# Patient Record
Sex: Male | Born: 1980 | Race: Black or African American | Hispanic: No | Smoking: Never smoker
Health system: Southern US, Community
[De-identification: ages and names within clinical notes are randomized; demographics above are authoritative.]

## PROBLEM LIST (undated history)

## (undated) DIAGNOSIS — I10 Essential (primary) hypertension: Secondary | ICD-10-CM

---

## 2019-01-23 ENCOUNTER — Encounter (HOSPITAL_COMMUNITY): Payer: Self-pay

## 2019-01-23 ENCOUNTER — Other Ambulatory Visit: Payer: Self-pay

## 2019-01-23 DIAGNOSIS — M5441 Lumbago with sciatica, right side: Secondary | ICD-10-CM | POA: Insufficient documentation

## 2019-01-23 DIAGNOSIS — R030 Elevated blood-pressure reading, without diagnosis of hypertension: Secondary | ICD-10-CM | POA: Diagnosis not present

## 2019-01-23 DIAGNOSIS — M545 Low back pain: Secondary | ICD-10-CM | POA: Diagnosis present

## 2019-01-23 NOTE — ED Triage Notes (Signed)
Pt reports lower back pain that radiates to left leg that started today. Pt denies injury to back.

## 2019-01-24 ENCOUNTER — Emergency Department (HOSPITAL_COMMUNITY)
Admission: EM | Admit: 2019-01-24 | Discharge: 2019-01-24 | Disposition: A | Discharge status: Home / Self Care | Payer: BC Managed Care – PPO | Attending: Emergency Medicine | Admitting: Emergency Medicine

## 2019-01-24 DIAGNOSIS — M5442 Lumbago with sciatica, left side: Secondary | ICD-10-CM

## 2019-01-24 DIAGNOSIS — R030 Elevated blood-pressure reading, without diagnosis of hypertension: Secondary | ICD-10-CM

## 2019-01-24 HISTORY — DX: Essential (primary) hypertension: I10

## 2019-01-24 MED ORDER — NAPROXEN 500 MG PO TABS: 500.0000 mg | ORAL_TABLET | Freq: Two times a day (BID) | ORAL | 0 refills | Status: DC

## 2019-01-24 MED ORDER — IBUPROFEN 800 MG PO TABS
800.0000 mg | ORAL_TABLET | Freq: Once | ORAL | Status: AC
  Administered 2019-01-24: 800 mg via ORAL
  Filled 2019-01-24: qty 1

## 2019-01-24 MED ORDER — ORPHENADRINE CITRATE ER 100 MG PO TB12: 100.0000 mg | ORAL_TABLET | Freq: Two times a day (BID) | ORAL | 0 refills | Status: DC

## 2019-01-24 MED ORDER — CYCLOBENZAPRINE HCL 10 MG PO TABS
10.0000 mg | ORAL_TABLET | Freq: Once | ORAL | Status: AC
  Administered 2019-01-24: 10 mg via ORAL
  Filled 2019-01-24: qty 1

## 2019-01-24 NOTE — ED Provider Notes (Signed)
[place] [facility] DEPT Provider Note   CSN: [id number] Arrival date & time: 01/23/19  2234    History   Chief Complaint Low back pain  HPI Mike Richardson is a 38 y.o. male.   The history is provided by the patient.  He had onset this morning pain in the left lower back which has gotten gradually worse through the day.  It is radiating down to the left gluteal area and proximal left posterior thigh.  Pain is dull and he rates it at 9/10.  It is better if he lays flat, worse if he sits up.  There is no associated weakness, numbness, tingling.  He denies any bowel or bladder dysfunction.  His job does involve a lot of bending and lifting, but he denies any unusual activity recently denies any trauma.  He has had low back pain in the past.  He took a dose of BC powder which did give slight, temporary relief.  Past Medical History:  Diagnosis Date  . Hypertension     There are no active problems to display for this patient.   History reviewed. No pertinent surgical history.      Home Medications    Prior to Admission medications   Not on File    Family History History reviewed. No pertinent family history.  Social History Social History   Tobacco Use  . Smoking status: Not on file  Substance Use Topics  . Alcohol use: Not on file  . Drug use: Not on file     Allergies   Patient has no allergy information on record.   Review of Systems Review of Systems  All other systems reviewed and are negative.    Physical Exam Updated Vital Signs BP (!) 157/82 (BP Location: Right Arm)   Pulse 84   Temp 98.2 F (36.8 C)   Resp 16   SpO2 99%   Physical Exam Vitals signs and nursing note reviewed.    38 year old male, resting comfortably and in no acute distress. Vital signs are significant for elevated blood pressure. Oxygen saturation is 99%, which is normal. Head is normocephalic and atraumatic. PERRLA, EOMI. Oropharynx is clear.  Neck is nontender and supple without adenopathy or JVD. Back is nontender in the midline, but there is mild tenderness in the left paralumbar area.  There is mild left paralumbar spasm.  Straight leg raise is negative.  There is no CVA tenderness. Lungs are clear without rales, wheezes, or rhonchi. Chest is nontender. Heart has regular rate and rhythm without murmur. Abdomen is soft, flat, nontender without masses or hepatosplenomegaly and peristalsis is normoactive. Extremities have no cyanosis or edema, full range of motion is present. Skin is warm and dry without rash. Neurologic: Mental status is normal, cranial nerves are intact, there are no motor or sensory deficits.  ED Treatments / Results   Procedures Procedures   Medications Ordered in ED Medications  ibuprofen (ADVIL) tablet 800 mg (has no administration in time range)  cyclobenzaprine (FLEXERIL) tablet 10 mg (has no administration in time range)     Initial Impression / Assessment and Plan / ED Course  I have reviewed the triage vital signs and the nursing notes.  Musculoskeletal low back pain with left-sided sciatica.  No red flags to suggest more serious pathology.  No indication for imaging.  He has no prior records in the [facility] health system.  His discharged with instructions to have blood pressure rechecked as an outpatient, given prescriptions  for naproxen and orphenadrine.  He has recently moved to [facility], he is referred to [facility] to try to establish local PCP.  Final Clinical Impressions(s) / ED Diagnoses   Final diagnoses:  Acute left-sided low back pain with left-sided sciatica  Elevated blood-pressure reading without diagnosis of hypertension    ED Discharge Orders         Ordered    naproxen (NAPROSYN) 500 MG tablet  2 times daily     01/24/19 0135    orphenadrine (NORFLEX) 100 MG tablet  2 times daily     01/24/19 0000000           Delora Fuel, MD [id number] [phone]

## 2019-01-24 NOTE — Discharge Instructions (Addendum)
Apply ice for 20-30 minutes at a time as needed.  You may take acetaminophen as needed for additional pain relief.  Do not take ibuprofen, since it is a similar medication to naproxen.  Your blood pressure was slightly elevated today.  Please have it rechecked several times over the next 1-2 weeks.  If it is staying high, you may need to be on medication to keep it under control.  Uncontrolled hypertension leads to heart attacks, strokes, kidney failure.

## 2019-02-02 ENCOUNTER — Other Ambulatory Visit: Payer: Self-pay

## 2019-02-02 ENCOUNTER — Encounter (HOSPITAL_COMMUNITY): Payer: Self-pay | Admitting: Emergency Medicine

## 2019-02-02 ENCOUNTER — Emergency Department (HOSPITAL_COMMUNITY)
Admission: EM | Admit: 2019-02-02 | Discharge: 2019-02-02 | Disposition: A | Discharge status: Home / Self Care | Payer: BC Managed Care – PPO | Attending: Emergency Medicine | Admitting: Emergency Medicine

## 2019-02-02 ENCOUNTER — Emergency Department (HOSPITAL_COMMUNITY): Payer: BC Managed Care – PPO

## 2019-02-02 DIAGNOSIS — Z5321 Procedure and treatment not carried out due to patient leaving prior to being seen by health care provider: Secondary | ICD-10-CM | POA: Diagnosis not present

## 2019-02-02 DIAGNOSIS — R0789 Other chest pain: Secondary | ICD-10-CM | POA: Diagnosis not present

## 2019-02-02 LAB — BASIC METABOLIC PANEL
Anion gap: 8 (ref 5–15)
BUN: 12 mg/dL (ref 6–20)
CO2: 24 mmol/L (ref 22–32)
Calcium: 9.1 mg/dL (ref 8.9–10.3)
Chloride: 108 mmol/L (ref 98–111)
Creatinine, Ser: 0.87 mg/dL (ref 0.61–1.24)
GFR calc Af Amer: >60 mL/min (ref >60)
GFR calc non Af Amer: >60 mL/min (ref >60)
Glucose, Bld: 104 mg/dL — ABNORMAL HIGH (ref 70–99)
Potassium: 3.8 mmol/L (ref 3.5–5.1)
Sodium: 140 mmol/L (ref 135–145)

## 2019-02-02 LAB — CBC
HCT: 42.9 % (ref 39.0–52.0)
Hemoglobin: 14.3 g/dL (ref 13.0–17.0)
MCH: 33.7 pg (ref 26.0–34.0)
MCHC: 33.3 g/dL (ref 30.0–36.0)
MCV: 101.2 fL — ABNORMAL HIGH (ref 80.0–100.0)
Platelets: 192 10*3/uL (ref 150–400)
RBC: 4.24 MIL/uL (ref 4.22–5.81)
RDW: 11.6 % (ref 11.5–15.5)
WBC: 4.5 10*3/uL (ref 4.0–10.5)
nRBC: 0 % (ref 0.0–0.2)

## 2019-02-02 LAB — TROPONIN I (HIGH SENSITIVITY): Troponin I (High Sensitivity): 6 ng/L (ref <18)

## 2019-02-02 MED ORDER — SODIUM CHLORIDE 0.9% FLUSH: 3.0000 mL | Freq: Once | INTRAVENOUS | Status: DC

## 2019-02-02 NOTE — ED Notes (Signed)
Pt stated feeling better and that he will like to go home

## 2019-02-02 NOTE — ED Triage Notes (Signed)
Pt arrives via [facility] from home for c/o chest pain that radiates into his arm, states pain awoke him from sleep. Pain worse with inhalation and exertion, not reproduceable with palpation, denies n/v/d. Pt given 324mg  asa and 1 sl nitro which decreased pain mildly. vss 111/82, hr 92, 97% on ra, rr 20, 97.9 temp. Pt a/ox4, resp e/u, nad.

## 2019-09-04 ENCOUNTER — Encounter (HOSPITAL_COMMUNITY): Payer: Self-pay

## 2019-09-04 ENCOUNTER — Other Ambulatory Visit: Payer: Self-pay

## 2019-09-04 ENCOUNTER — Emergency Department (HOSPITAL_COMMUNITY)
Admission: EM | Admit: 2019-09-04 | Discharge: 2019-09-05 | Disposition: A | Discharge status: Home / Self Care | Payer: BC Managed Care – PPO | Attending: Emergency Medicine | Admitting: Emergency Medicine

## 2019-09-04 DIAGNOSIS — Z20822 Contact with and (suspected) exposure to covid-19: Secondary | ICD-10-CM | POA: Insufficient documentation

## 2019-09-04 DIAGNOSIS — I10 Essential (primary) hypertension: Secondary | ICD-10-CM | POA: Diagnosis not present

## 2019-09-04 DIAGNOSIS — R45851 Suicidal ideations: Secondary | ICD-10-CM | POA: Diagnosis not present

## 2019-09-04 DIAGNOSIS — F331 Major depressive disorder, recurrent, moderate: Secondary | ICD-10-CM | POA: Diagnosis not present

## 2019-09-04 LAB — RAPID URINE DRUG SCREEN, HOSP PERFORMED
Amphetamines: NOT DETECTED
Barbiturates: NOT DETECTED
Benzodiazepines: NOT DETECTED
Cocaine: NOT DETECTED
Opiates: NOT DETECTED
Tetrahydrocannabinol: NOT DETECTED

## 2019-09-04 LAB — CBC
HCT: 41.2 % (ref 39.0–52.0)
Hemoglobin: 13.8 g/dL (ref 13.0–17.0)
MCH: 33.7 pg (ref 26.0–34.0)
MCHC: 33.5 g/dL (ref 30.0–36.0)
MCV: 100.5 fL — ABNORMAL HIGH (ref 80.0–100.0)
Platelets: 209 10*3/uL (ref 150–400)
RBC: 4.1 MIL/uL — ABNORMAL LOW (ref 4.22–5.81)
RDW: 12 % (ref 11.5–15.5)
WBC: 5.9 10*3/uL (ref 4.0–10.5)
nRBC: 0 % (ref 0.0–0.2)

## 2019-09-04 LAB — SALICYLATE LEVEL: Salicylate Lvl: <7 mg/dL — ABNORMAL LOW (ref 7.0–30.0)

## 2019-09-04 LAB — COMPREHENSIVE METABOLIC PANEL
ALT: 37 U/L (ref 0–44)
AST: 34 U/L (ref 15–41)
Albumin: 4.2 g/dL (ref 3.5–5.0)
Alkaline Phosphatase: 52 U/L (ref 38–126)
Anion gap: 11 (ref 5–15)
BUN: 12 mg/dL (ref 6–20)
CO2: 24 mmol/L (ref 22–32)
Calcium: 9.2 mg/dL (ref 8.9–10.3)
Chloride: 106 mmol/L (ref 98–111)
Creatinine, Ser: 0.76 mg/dL (ref 0.61–1.24)
GFR calc Af Amer: >60 mL/min (ref >60)
GFR calc non Af Amer: >60 mL/min (ref >60)
Glucose, Bld: 87 mg/dL (ref 70–99)
Potassium: 3.7 mmol/L (ref 3.5–5.1)
Sodium: 141 mmol/L (ref 135–145)
Total Bilirubin: 0.7 mg/dL (ref 0.3–1.2)
Total Protein: 7.9 g/dL (ref 6.5–8.1)

## 2019-09-04 LAB — ACETAMINOPHEN LEVEL: Acetaminophen (Tylenol), Serum: <10 ug/mL — ABNORMAL LOW (ref 10–30)

## 2019-09-04 LAB — SARS CORONAVIRUS 2 BY RT PCR (HOSPITAL ORDER, PERFORMED IN ~~LOC~~ HOSPITAL LAB): SARS Coronavirus 2: NEGATIVE

## 2019-09-04 LAB — ETHANOL: Alcohol, Ethyl (B): <10 mg/dL (ref <10)

## 2019-09-04 MED ORDER — ZOLPIDEM TARTRATE 5 MG PO TABS: 5.0000 mg | ORAL_TABLET | Freq: Every evening | ORAL | Status: DC | PRN

## 2019-09-04 MED ORDER — ONDANSETRON HCL 4 MG PO TABS: 4.0000 mg | ORAL_TABLET | Freq: Three times a day (TID) | ORAL | Status: DC | PRN

## 2019-09-04 MED ORDER — ALUM & MAG HYDROXIDE-SIMETH 200-200-20 MG/5ML PO SUSP: 30.0000 mL | Freq: Four times a day (QID) | ORAL | Status: DC | PRN

## 2019-09-04 MED ORDER — ACETAMINOPHEN 325 MG PO TABS: 650.0000 mg | ORAL_TABLET | ORAL | Status: DC | PRN

## 2019-09-04 NOTE — ED Triage Notes (Signed)
Pt reports that he has been feeling suicidal since Tuesday. States that he attempted suicide in 2019. Denies any plan or means to kill himself at this time, but states that his counselor told him to come to the ED if he started experiencing these thoughts. Also reports increased depression.

## 2019-09-04 NOTE — ED Provider Notes (Signed)
[name] DEPT Provider Note   CSN: [id number] Arrival date & time: 09/04/19  1819     History Chief Complaint  Patient presents with  . Suicidal    Mike Richardson is a 39 y.o. male.  HPI 39 year old male presents with suicidal thoughts.  Several years ago he had a suicide attempt.  Over the last several months he has not felt himself but stop short of saying it is actually depression.  This is rapidly worse over the last week and today he has had suicidal thoughts.  No plan.  He is concerned about this and came in for psychiatric evaluation.  He denies feeling ill otherwise including no fever, cough, chest pain, etc. No alcohol or drug abuse.   Past Medical History:  Diagnosis Date  . Hypertension     There are no problems to display for this patient.   History reviewed. No pertinent surgical history.     History reviewed. No pertinent family history.  Social History   Tobacco Use  . Smoking status: Not on file  Substance Use Topics  . Alcohol use: Not on file  . Drug use: Not on file    Home Medications Prior to Admission medications   Medication Sig Start Date End Date Taking? Authorizing Provider  naproxen (NAPROSYN) 500 MG tablet Take 1 tablet (500 mg total) by mouth 2 (two) times daily. Patient not taking: Reported on 7/35/3299 24/2/68   Delora Fuel, MD  orphenadrine (NORFLEX) 100 MG tablet Take 1 tablet (100 mg total) by mouth 2 (two) times daily. Patient not taking: Reported on 3/41/9622 29/7/98   Delora Fuel, MD    Allergies    Patient has no known allergies.  Review of Systems   Review of Systems  Constitutional: Negative for fever.  Respiratory: Negative for cough and shortness of breath.   Cardiovascular: Negative for chest pain.  Gastrointestinal: Negative for vomiting.  Psychiatric/Behavioral: Positive for dysphoric mood and suicidal ideas.  All other systems reviewed and are negative.   Physical  Exam Updated Vital Signs BP (!) 156/96 (BP Location: Left Arm)   Pulse 90   Temp 98.3 F (36.8 C) (Oral)   Resp 16   SpO2 97%   Physical Exam Vitals and nursing note reviewed.  Constitutional:      Appearance: He is well-developed. He is obese.  HENT:     Head: Normocephalic and atraumatic.     Right Ear: External ear normal.     Left Ear: External ear normal.     Nose: Nose normal.  Eyes:     General:        Right eye: No discharge.        Left eye: No discharge.  Cardiovascular:     Rate and Rhythm: Normal rate and regular rhythm.     Heart sounds: Normal heart sounds.  Pulmonary:     Effort: Pulmonary effort is normal.     Breath sounds: Normal breath sounds.  Abdominal:     General: There is no distension.  Musculoskeletal:     Cervical back: Neck supple.  Skin:    General: Skin is warm and dry.  Neurological:     Mental Status: He is alert.  Psychiatric:        Mood and Affect: Mood is depressed. Mood is not anxious.     ED Results / Procedures / Treatments   Labs (all labs ordered are listed, but only abnormal results are displayed) Labs Reviewed  SALICYLATE LEVEL - Abnormal; Notable for the following components:      Result Value   Salicylate Lvl <9.3 (*)    All other components within normal limits  ACETAMINOPHEN LEVEL - Abnormal; Notable for the following components:   Acetaminophen (Tylenol), Serum <10 (*)    All other components within normal limits  CBC - Abnormal; Notable for the following components:   RBC 4.10 (*)    MCV 100.5 (*)    All other components within normal limits  SARS CORONAVIRUS 2 BY RT PCR (HOSPITAL ORDER, [facility])  COMPREHENSIVE METABOLIC PANEL  ETHANOL  RAPID URINE DRUG SCREEN, HOSP PERFORMED    EKG None  Radiology No results found.  Procedures Procedures (including critical care time)  Medications Ordered in ED Medications  acetaminophen (TYLENOL) tablet 650 mg (has no  administration in time range)  zolpidem (AMBIEN) tablet 5 mg (has no administration in time range)  ondansetron (ZOFRAN) tablet 4 mg (has no administration in time range)  alum & mag hydroxide-simeth (MAALOX/MYLANTA) 200-200-20 MG/5ML suspension 30 mL (has no administration in time range)    ED Course  I have reviewed the triage vital signs and the nursing notes.  Pertinent labs & imaging results that were available during my care of the patient were reviewed by me and considered in my medical decision making (see chart for details).    MDM Rules/Calculators/A&P                          Patient presents with suicidality.  He does not have an active plan but certainly is becoming more depressed.  Labs have been reviewed and are benign.  His vitals are benign besides some hypertension.  He appears medically stable for psychiatric disposition.  TTS has been consulted. Final Clinical Impression(s) / ED Diagnoses Final diagnoses:  Suicidal ideation    Rx / DC Orders ED Discharge Orders    None       Sherwood Gambler, MD 09/04/19 2351

## 2019-09-04 NOTE — ED Notes (Signed)
2 Gold Tops collected

## 2019-09-05 DIAGNOSIS — F331 Major depressive disorder, recurrent, moderate: Secondary | ICD-10-CM

## 2019-09-05 MED ORDER — SERTRALINE HCL 25 MG PO TABS: 25.0000 mg | ORAL_TABLET | Freq: Every day | ORAL | 0 refills | Status: DC

## 2019-09-05 MED ORDER — SERTRALINE HCL 50 MG PO TABS
25.0000 mg | ORAL_TABLET | Freq: Every day | ORAL | Status: DC
  Administered 2019-09-05: 25 mg via ORAL
  Filled 2019-09-05: qty 1

## 2019-09-05 NOTE — Consult Note (Signed)
[facility] Psych ED Discharge  09/05/2019 10:17 AM Mike Richardson  MRN:  [id number] Principal Problem: MDD (major depressive disorder), recurrent episode, moderate ([place]) Discharge Diagnoses: Principal Problem:   MDD (major depressive disorder), recurrent episode, moderate ([place])   Subjective: Patient states "I think I need to go to outpatient counseling and therapy." Patient assessed by nurse practitioner.  Patient alert and oriented, answers appropriately. Patient denies suicidal ideations today.  Patient reports suicidal ideations approximately 2 days ago, denies plan or intent.  Patient reports 1 prior suicide attempt, approximately 2 years ago.  Patient denies outpatient psychiatrist.  Patient reports history of seeing outpatient talk therapist but discontinued therapy when "it worked pretty good and I figured I was all right." Patient reports he is interested in antidepressant medication along with therapy.  Discussed antidepressant medications including SSRIs. Patient reports decreased sleep, approximately 4 hours per night.  Patient reports plan to transition to dayshift in approximately 2 weeks.  Patient reports this change to dayshift employment may be a positive one regarding his mood.  Patient reports current stressors include "working night shift and me and my mom do not have the best relationship and that bothers me at times, she never says anything positive, always something negative and she does not encourage me enough as a son." Patient denies homicidal ideations.  Patient denies auditory and visual hallucinations.  Patient denies symptoms of paranoia. Patient reports he resides in [place] with his 3 children ages 43, 31, and 67 years old.  Patient reports he is currently employed, currently works night shift.  Patient denies access to weapons.  Patient denies alcohol and substance use. Patient offered support and encouragement. Patient gives verbal consent to speak with his  girlfriend, Wyvonnia Dusky phone number [phone]. Spoke with patient's girlfriend, Katrina: Patient's girlfriend denies concerns for patient safety.  Patient's girlfriend denies access to weapons.  Patient girlfriend denies any history of self-harm to her knowledge.   Total Time spent with patient: 20 minutes  Past Psychiatric History: Depression  Past Medical History:  Past Medical History:  Diagnosis Date  . Hypertension    History reviewed. No pertinent surgical history. Family History: History reviewed. No pertinent family history. Family Psychiatric  History: None reported Social History:  Social History   Substance and Sexual Activity  Alcohol Use None     Social History   Substance and Sexual Activity  Drug Use Not on file    Social History   Socioeconomic History  . Marital status: Divorced    Spouse name: Not on file  . Number of children: Not on file  . Years of education: Not on file  . Highest education level: Not on file  Occupational History  . Not on file  Tobacco Use  . Smoking status: Not on file  Substance and Sexual Activity  . Alcohol use: Not on file  . Drug use: Not on file  . Sexual activity: Not on file  Other Topics Concern  . Not on file  Social History Narrative  . Not on file   Social Determinants of Health   Financial Resource Strain:   . Difficulty of Paying Living Expenses:   Food Insecurity:   . Worried About Charity fundraiser in the Last Year:   . Arboriculturist in the Last Year:   Transportation Needs:   . Film/video editor (Medical):   Marland Kitchen Lack of Transportation (Non-Medical):   Physical Activity:   . Days of Exercise per Week:   .  Minutes of Exercise per Session:   Stress:   . Feeling of Stress :   Social Connections:   . Frequency of Communication with Friends and Family:   . Frequency of Social Gatherings with Friends and Family:   . Attends Religious Services:   . Active Member of Clubs or  Organizations:   . Attends Archivist Meetings:   Marland Kitchen Marital Status:     Has this patient used any form of tobacco in the last 30 days? (Cigarettes, Smokeless Tobacco, Cigars, and/or Pipes) A prescription for an FDA-approved tobacco cessation medication was offered at discharge and the patient refused  Current Medications: Current Facility-Administered Medications  Medication Dose Route Frequency Provider Last Rate Last Admin  . acetaminophen (TYLENOL) tablet 650 mg  650 mg Oral Q4H PRN Sherwood Gambler, MD      . alum & mag hydroxide-simeth (MAALOX/MYLANTA) 200-200-20 MG/5ML suspension 30 mL  30 mL Oral Q6H PRN Sherwood Gambler, MD      . ondansetron [facility]) tablet 4 mg  4 mg Oral Q8H PRN Sherwood Gambler, MD      . zolpidem (AMBIEN) tablet 5 mg  5 mg Oral QHS PRN Sherwood Gambler, MD       Current Outpatient Medications  Medication Sig Dispense Refill  . naproxen (NAPROSYN) 500 MG tablet Take 1 tablet (500 mg total) by mouth 2 (two) times daily. (Patient not taking: Reported on 09/04/2019) 30 tablet 0  . orphenadrine (NORFLEX) 100 MG tablet Take 1 tablet (100 mg total) by mouth 2 (two) times daily. (Patient not taking: Reported on 09/04/2019) 30 tablet 0   PTA Medications: (Not in a hospital admission)   Musculoskeletal: Strength & Muscle Tone: within normal limits Gait & Station: normal Patient leans: N/A  Psychiatric Specialty Exam: Physical Exam Vitals and nursing note reviewed.  Constitutional:      Appearance: He is well-developed.  HENT:     Head: Normocephalic.  Cardiovascular:     Rate and Rhythm: Normal rate.  Pulmonary:     Effort: Pulmonary effort is normal.  Neurological:     Mental Status: He is alert and oriented to person, place, and time.  Psychiatric:        Attention and Perception: Attention and perception normal.        Mood and Affect: Affect normal. Mood is depressed.        Speech: Speech normal.        Behavior: Behavior normal. Behavior is  cooperative.        Thought Content: Thought content normal.        Cognition and Memory: Cognition and memory normal.        Judgment: Judgment normal.     Review of Systems  Constitutional: Negative.   HENT: Negative.   Eyes: Negative.   Respiratory: Negative.   Cardiovascular: Negative.   Gastrointestinal: Negative.   Genitourinary: Negative.   Musculoskeletal: Negative.   Skin: Negative.   Neurological: Negative.   Hematological: Negative.   Psychiatric/Behavioral: Positive for sleep disturbance.    Blood pressure 125/83, pulse 65, temperature 98.5 F (36.9 C), temperature source Oral, resp. rate 18, SpO2 100 %.There is no height or weight on file to calculate BMI.  General Appearance: Casual and Fairly Groomed  Eye Contact:  Good  Speech:  Clear and Coherent and Normal Rate  Volume:  Normal  Mood:  Depressed  Affect:  Appropriate and Congruent  Thought Process:  Coherent, Goal Directed and Descriptions of Associations: Intact  Orientation:  Full (Time, Place, and Person)  Thought Content:  WDL and Logical  Suicidal Thoughts:  No  Homicidal Thoughts:  No  Memory:  Immediate;   Good Recent;   Good Remote;   Good  Judgement:  Good  Insight:  Good  Psychomotor Activity:  Normal  Concentration:  Concentration: Good and Attention Span: Good  Recall:  Good  Fund of Knowledge:  Good  Language:  Good  Akathisia:  No  Handed:  Right  AIMS (if indicated):     Assets:  Communication Skills Desire for Improvement Financial Resources/Insurance Housing Intimacy Leisure Time Physical Health Resilience Social Support Vocational/Educational  ADL's:  Intact  Cognition:  WNL  Sleep:        Demographic Factors:  Male  Loss Factors: NA  Historical Factors: NA  Risk Reduction Factors:   Responsible for children under 63 years of age, Sense of responsibility to family, Employed, Living with another person, especially a relative, Positive social support, Positive  therapeutic relationship and Positive coping skills or problem solving skills  Continued Clinical Symptoms:  Depression:   Insomnia  Cognitive Features That Contribute To Risk:  None    Suicide Risk:  Minimal: No identifiable suicidal ideation.  Patients presenting with no risk factors but with morbid ruminations; may be classified as minimal risk based on the severity of the depressive symptoms    Plan Of Care/Follow-up recommendations:  Other:  Patient reviewed with Dr. Hampton Abbot.  Recommend Zoloft 25mg  daily.  Follow up with outpatient psychiatry and counseling.   Disposition: Discharge Emmaline Kluver, FNP 09/05/2019, 10:17 AM

## 2019-09-05 NOTE — BH Assessment (Signed)
Comprehensive Clinical Assessment (CCA) Note  09/05/2019 Mike Richardson [id number] -Clinician reviewed note by Dr. Regenia Skeeter.  39 year old male presents with suicidal thoughts.  Several years ago he had a suicide attempt.  Over the last several months he has not felt himself but stop short of saying it is actually depression.  This is rapidly worse over the last week and today he has had suicidal thoughts.  No plan.  He is concerned about this and came in for psychiatric evaluation.  Patient says that he came to [facility] by himself to get help for his suicidal thoughts.  He has no particular plan but he has had a previous attempt in 2019.  At that time he did not seek help.    Patient denies any HI or A/V hallucinations.  He denies use of ETOH or illicit drugs.  Patient says he has been feeling very depressed since around Tuesday (07/13).  He says that he does not see why he should be so depressed because he recently got back together with his girlfriend.  Patient has fair eye contact.  He reports getting poor sleep and having a poor appetite.  Patient is not responding to internal stimuli.  Pt is not engaged in delusional thought process.   Patient has no outpatient services.  No previous inpatient care.  -Clinician discussed patient care with Talbot Grumbling, NP who recommends observation and AM psych evaluation. Clinician informed Dr. Leonides Schanz of the disposition.  Visit Diagnosis:      ICD-10-CM   1. Suicidal ideation  R45.851       CCA Screening, Triage and Referral (STR)  Patient Reported Information How did you hear about [place]? Self  Referral name: No data recorded Referral phone number: No data recorded  Whom do you see for routine medical problems? I don't have a doctor  Practice/Facility Name: No data recorded Practice/Facility Phone Number: No data recorded Name of Contact: No data recorded Contact Number: No data recorded Contact Fax Number: No data recorded Prescriber Name:  No data recorded Prescriber Address (if known): No data recorded  What Is the Reason for Your Visit/Call Today? Not feeling right mentally since 07/13.  How Long Has This Been Causing You Problems? <Week  What Do You Feel Would Help You the Most Today? Assessment Only   Have You Recently Been in Any Inpatient Treatment (Hospital/Detox/Crisis Center/28-Day Program)? No  Name/Location of Program/Hospital:No data recorded How Long Were You There? No data recorded When Were You Discharged? No data recorded  Have You Ever Received Services From [facility] Before? Yes  Who Do You See at [facility]? in the emergency room in December 2020.   Have You Recently Had Any Thoughts About Hurting Yourself? Yes  Are You Planning to Commit Suicide/Harm Yourself At This time? No (Previous attempt in 2019.)   Have you Recently Had Thoughts About [place]? No  Explanation: No data recorded  Have You Used Any Alcohol or Drugs in the Past 24 Hours? No  How Long Ago Did You Use Drugs or Alcohol? No data recorded What Did You Use and How Much? No data recorded  Do You Currently Have a Therapist/Psychiatrist? No  Name of Therapist/Psychiatrist: No data recorded  Have You Been Recently Discharged From Any Office Practice or Programs? No  Explanation of Discharge From Practice/Program: No data recorded    CCA Screening Triage Referral Assessment Type of Contact: Tele-Assessment  Is this Initial or Reassessment? Initial Assessment  Date Telepsych consult ordered in  [facility]:  09/04/19  Time Telepsych consult ordered in [facility]:  1937   Patient Reported Information Reviewed? Yes  Patient Left Without Being Seen? No data recorded Reason for Not Completing Assessment: No data recorded  Collateral Involvement: No data recorded  Does Patient Have a [place]? No data recorded Name and Contact of Legal Guardian: No data recorded If Minor and Not Living with  Parent(s), Who has Custody? No data recorded Is CPS involved or ever been involved? No data recorded Is APS involved or ever been involved? No data recorded  Patient Determined To Be At Risk for Harm To Self or Others Based on Review of Patient Reported Information or Presenting Complaint? Yes, for Self-Harm  Method: No data recorded Availability of Means: No data recorded Intent: No data recorded Notification Required: No data recorded Additional Information for Danger to Others Potential: No data recorded Additional Comments for Danger to Others Potential: No data recorded Are There Guns or Other Weapons in Your Home? No data recorded Types of Guns/Weapons: No data recorded Are These Weapons Safely Secured?                            No data recorded Who Could Verify You Are Able To Have These Secured: No data recorded Do You Have any Outstanding Charges, Pending Court Dates, Parole/Probation? No data recorded Contacted To Inform of Risk of Harm To Self or Others: No data recorded  Location of Assessment: [facility] ED   Does Patient Present under Involuntary Commitment? No  IVC Papers Initial File Date: No data recorded  [place] of Residence: [place]   Patient Currently Receiving the Following Services: Not Receiving Services   Determination of Need: No data recorded  Options For Referral: Therapeutic Triage Services     CCA Biopsychosocial  Intake/Chief Complaint:  CCA Intake With Chief Complaint CCA Part Two Date: 09/05/19 CCA Part Two Time: 0310 Chief Complaint/Presenting Problem: Pt having thoguhts of killing himself Patient's Currently Reported Symptoms/Problems: Depression, SI. Initial Clinical Notes/Concerns: Pt feeling very depressed.  Mental Health Symptoms Depression:  Depression: Change in energy/activity, Hopelessness, Difficulty Concentrating, Increase/decrease in appetite, Weight gain/loss, Worthlessness, Duration of symptoms greater than two weeks  Mania:   Mania: None  Anxiety:   Anxiety: None  Psychosis:  Psychosis: None  Trauma:  Trauma: None  Obsessions:  Obsessions: None  Compulsions:  Compulsions: None  Inattention:  Inattention: None  Hyperactivity/Impulsivity:  Hyperactivity/Impulsivity: N/A  Oppositional/Defiant Behaviors:  Oppositional/Defiant Behaviors: None  Emotional Irregularity:     Other Mood/Personality Symptoms:      Mental Status Exam Appearance and self-care  Stature:  Stature: Tall  Weight:     Clothing:     Grooming:     Cosmetic use:     Posture/gait:     Motor activity:     Sensorium  Attention:     Concentration:     Orientation:  Orientation: X5  Recall/memory:  Recall/Memory: Defective in Recent  Affect and Mood  Affect:  Affect: Blunted, Depressed  Mood:  Mood: Depressed  Relating  Eye contact:  Eye Contact: Normal  Facial expression:  Facial Expression: Depressed  Attitude toward examiner:  Attitude Toward Examiner: Cooperative  Thought and Language  Speech flow: Speech Flow: Clear and Coherent  Thought content:  Thought Content: Appropriate to Mood and Circumstances  Preoccupation:     Hallucinations:  Hallucinations: None  Organization:     [employer] of Knowledge:  Fund of Knowledge: Average  Intelligence:  Intelligence: Average  Abstraction:     Judgement:  Judgement: Fair  Art therapist:  Reality Testing: Realistic  Insight:  Insight: Fair  Decision Making:  Decision Making: Normal  Social Functioning  Social Maturity:     Social Judgement:     Stress  Stressors:  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, Museum/gallery curator, Work  Coping Ability:  Coping Ability: Normal  Skill Deficits:     Supports:  Supports: Friends/Service system     Religion:    Leisure/Recreation:    Exercise/Diet:     CCA Employment/Education  Employment/Work Situation:    Education:     CCA Family/Childhood History  Family and Relationship History:    Childhood History:      Child/Adolescent Assessment:     CCA Substance Use  Alcohol/Drug Use:                           ASAM's:  Six Dimensions of Multidimensional Assessment  Dimension 1:  Acute Intoxication and/or Withdrawal Potential:      Dimension 2:  Biomedical Conditions and Complications:      Dimension 3:  Emotional, Behavioral, or Cognitive Conditions and Complications:     Dimension 4:  Readiness to Change:     Dimension 5:  Relapse, Continued use, or Continued Problem Potential:     Dimension 6:  Recovery/Living Environment:     ASAM Severity Score:    ASAM Recommended Level of Treatment:     Substance use Disorder (SUD)    Recommendations for Services/Supports/Treatments:    DSM5 Diagnoses: There are no problems to display for this patient.   Patient Centered Plan: Patient is on the following Treatment Plan(s):  Depression   Referrals to Alternative Service(s): Referred to Alternative Service(s):   Place:   Date:   Time:    Referred to Alternative Service(s):   Place:   Date:   Time:    Referred to Alternative Service(s):   Place:   Date:   Time:    Referred to Alternative Service(s):   Place:   Date:   Time:     Raymondo Band

## 2019-09-05 NOTE — Discharge Instructions (Signed)
For your behavioral health needs, you are advised to follow up with an outpatient therapist.  You are scheduled for an intake appointment with Shade Flood, LCSW on Monday, September 08, 2019 at 1:00 pm.  Please complete the registration paperwork provided by ED staff prior to the appointment.  If you have any questions, please call the number listed below:       [facility]. [address] [place], [place] [address]      [phone]

## 2019-09-05 NOTE — BH Assessment (Addendum)
[facility] Assessment Progress Note  Per Marvia Pickles, FNP, pt is to be is transferred to the [facility].  Matt agrees to accept pt.  Please call report to [phone].  Pt is to be transported via [facility].  Pt's nurse, Diane, has been notified.   Jalene Mullet, [place] Behavioral Health Coordinator [phone]   Addendum:  Per Letitia Libra, FNP, this voluntary pt does not want to be transferred to [facility] and does not meet criteria for IVC.  Pt would like to have an intake appointment scheduled for outpatient therapy.  At 10:12 I called the [facility] at [place] and spoke to [place].  She has scheduled pt for an appointment with Shade Flood, LCSW on Monday, 09/08/2019 at 13:00.  This has been included in pt's discharge instructions.  Pt will be provided with registration paperwork for the Outpatient Clinic, with instructions to complete it before the appointment.  Pt's nurse, Diane, has been notified.  Jalene Mullet, [place] Coordinator [phone]

## 2019-09-05 NOTE — ED Notes (Signed)
Pt discharged home. Discharged instructions read to pt who verbalized understanding. All belongings returned to pt. Denies SI/HI, is not delusional and not responding to internal stimuli. Escorted pt to the ED exit.   

## 2019-09-05 NOTE — ED Provider Notes (Signed)
3:53 AM  Spoke with Beverely Low with TTS.  Pt here with SI.  Has had previous suicide attempt.  TTS recommends overnight observation and a.m. psychiatric evaluation.  I reviewed all nursing notes and pertinent previous records as available.  I have reviewed and interpreted any EKGs, lab and urine results, imaging (as available).    Tequita Marrs, Delice Bison, DO 09/05/19 [phone]

## 2019-09-08 ENCOUNTER — Ambulatory Visit (INDEPENDENT_AMBULATORY_CARE_PROVIDER_SITE_OTHER): Payer: BC Managed Care – PPO | Admitting: Licensed Clinical Social Worker

## 2019-09-08 ENCOUNTER — Other Ambulatory Visit: Payer: Self-pay

## 2019-09-08 DIAGNOSIS — F332 Major depressive disorder, recurrent severe without psychotic features: Secondary | ICD-10-CM
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
## 2019-09-10 ENCOUNTER — Encounter (HOSPITAL_COMMUNITY): Payer: Self-pay | Admitting: Psychiatry

## 2019-09-10 ENCOUNTER — Telehealth (INDEPENDENT_AMBULATORY_CARE_PROVIDER_SITE_OTHER): Payer: BC Managed Care – PPO | Admitting: Psychiatry

## 2019-09-10 ENCOUNTER — Other Ambulatory Visit: Payer: Self-pay

## 2019-09-10 DIAGNOSIS — F331 Major depressive disorder, recurrent, moderate: Secondary | ICD-10-CM | POA: Diagnosis not present

## 2019-09-10 MED ORDER — SERTRALINE HCL 50 MG PO TABS: 50.0000 mg | ORAL_TABLET | Freq: Every day | ORAL | 0 refills | Status: DC

## 2019-09-10 MED ORDER — TRAZODONE HCL 100 MG PO TABS: 100.0000 mg | ORAL_TABLET | Freq: Every evening | ORAL | 0 refills | Status: DC | PRN

## 2019-09-10 NOTE — Progress Notes (Signed)
Psychiatric Initial Adult Assessment   Patient Identification: Mike Richardson MRN:  [id number] Date of Evaluation:  09/10/2019 Referral Source: Chryl Heck Chief Complaint:  Depressed mood, initial insomnia.  Interview was conducted using videoconferencing application and I verified that I was speaking with the correct person using two identifiers. I discussed the limitations of evaluation and management by telemedicine and  the availability of in person appointments. Patient expressed understanding and agreed to proceed. Patient location - home; physician - home office.  Visit Diagnosis:    ICD-10-CM   1. MDD (major depressive disorder), recurrent episode, moderate (HCC)  F33.1     History of Present Illness:  Mike Richardson is a 39 yo divorced AAM who comes for initial visit after he recently has been seen in ED (on 09/04/19) with increased depression and passive SI. He was started on sertraline 25 mg which has been tolerating well. Mike Richardson reports that for the past month or so he has been having fatigue, trouble concentrating, decreased appetite, irritability, weight loss, worthlessness.  He also reported excessive worrying, racing thoughts. He has had depressive episodes in the past: in 2019 he attempted suicide by drinking bleach. He was admitted to hospital and began therapy following discharge but ended treatment after 3 sessions. Interestingly enough, he has never been prescribed antidepressant medications before. His sleep problems are more protracted - has works 3rd shift and has difficulty falling asleep after returning home in AM.  He was diagnosed with ADHD in elementary school and was prescribed Ritalin (it was helpful he remembers). He stopped taking it in middle school as his ability to focus improved. He still tends to avoid activities that require longer focus, disorganization, struggling to follow instructions/listen, forgetfulness, poor follow through on tasks. His weight  loss was somewhat intentional - lost 25 lbs in past 5 months, as it helped his blood pressure for which he no longer needs to take medication.  Mike Richardson denied history of drug or alcohol abuse, and noted that when he does drink, it is socially, typically 1-2 x per month with friends and not more than 2-3 beers.  Mike Richardson reported that his primary stressors which could be influencing increased depression include erratic work schedule (four 12 hours days on and off each week), as well as recently working to repair relationship following period of he and fianc living apart while resolving issues.    Associated Signs/Symptoms: Depression Symptoms:  depressed mood, insomnia, fatigue, difficulty concentrating, anxiety, (Hypo) Manic Symptoms:  Irritable Mood, Anxiety Symptoms:  Excessive Worry, Psychotic Symptoms:  None PTSD Symptoms: Negative  Past Psychiatric History: Please see above.  Previous Psychotropic Medications: No   Substance Abuse History in the last 12 months:  No.  Consequences of Substance Abuse: NA  Past Medical History:  Past Medical History:  Diagnosis Date  . Hypertension    History reviewed. No pertinent surgical history.  Family Psychiatric History: None.  Family History: History reviewed. No pertinent family history.  Social History:   Social History   Socioeconomic History  . Marital status: Divorced    Spouse name: Not on file  . Number of children: 3  . Years of education: Not on file  . Highest education level: Not on file  Occupational History  . Not on file  Tobacco Use  . Smoking status: Never Smoker  . Smokeless tobacco: Never Used  Vaping Use  . Vaping Use: Never used  Substance and Sexual Activity  . Alcohol use: Not on file    Comment:  socially  . Drug use: Never  . Sexual activity: Not on file  Other Topics Concern  . 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    Social Determinants of Health   Financial Resource Strain:   . Difficulty of Paying Living Expenses:   Food Insecurity:   . Worried About Charity fundraiser in the Last Year:   . Arboriculturist in the Last Year:   Transportation Needs:   . Film/video editor (Medical):   Marland Kitchen Lack of Transportation (Non-Medical):   Physical Activity:   . Days of Exercise per Week:   . Minutes of Exercise per Session:   Stress:   . Feeling of Stress :   Social Connections:   . Frequency of Communication with Friends and Family:   . Frequency of Social Gatherings with Friends and Family:   . Attends Religious Services:   . Active Member of Clubs or Organizations:   . Attends Archivist Meetings:   Marland Kitchen Marital Status:     Additional Social History: Patient has a GF/fiancee.  Allergies:  No Known Allergies  Metabolic Disorder Labs: No results found for: HGBA1C, MPG No results found for: PROLACTIN No results found for: CHOL, TRIG, HDL, CHOLHDL, VLDL, LDLCALC No results found for: TSH  Therapeutic Level Labs: No results found for: LITHIUM No results found for: CBMZ No results found for: VALPROATE  Current Medications: Current Outpatient Medications  Medication Sig Dispense Refill  . sertraline (ZOLOFT) 50 MG tablet Take 1 tablet (50 mg total) by mouth daily. 30 tablet 0  . traZODone (DESYREL) 100 MG tablet Take 1 tablet (100 mg total) by mouth at bedtime as needed for sleep. 30 tablet 0   No current facility-administered medications for this visit.     Psychiatric Specialty Exam: Review of Systems  Psychiatric/Behavioral: Positive for decreased concentration and sleep disturbance. The patient is nervous/anxious.   All other systems reviewed and are negative.   There were no vitals taken for this visit.There is no height or weight on file to calculate BMI.  General Appearance: Casual  Eye Contact:  Good  Speech:   Clear and Coherent and Normal Rate  Volume:  Normal  Mood:  Depressed  Affect:  Non-Congruent and Full Range  Thought Process:  Goal Directed  Orientation:  Full (Time, Place, and Person)  Thought Content:  Logical  Suicidal Thoughts:  No  Homicidal Thoughts:  No  Memory:  Immediate;   Good Recent;   Good Remote;   Good  Judgement:  Good  Insight:  Good  Psychomotor Activity:  Normal  Concentration:  Concentration: Fair  Recall:  Good  Fund of Knowledge:Good  Language: Good  Akathisia:  Negative  Handed:  Right  AIMS (if indicated):  not done  Assets:  Communication Skills Desire for Improvement Financial Resources/Insurance Housing Physical Health Talents/Skills  ADL's:  Intact  Cognition: WNL  Sleep:  Fair    Assessment and Plan: 39 yo divorced AAM who comes for initial visit after he recently has been seen in ED (on 09/04/19) with increased depression and passive SI. He was started on sertraline 25 mg which has been tolerating well. Mike Richardson reports that for the past month or so he has been having fatigue, trouble concentrating, decreased appetite, irritability, weight loss, worthlessness.  He also reported excessive  worrying, racing thoughts. He has had depressive episodes in the past: in 2019 he attempted suicide by drinking bleach. He was admitted to hospital and began therapy following discharge but ended treatment after 3 sessions. Interestingly enough, he has never been prescribed antidepressant medications before. His sleep problems are more protracted - has works 3rd shift and has difficulty falling asleep after returning home in AM.  He was diagnosed with ADHD in elementary school and was prescribed Ritalin (it was helpful he remembers). He stopped taking it in middle school as his ability to focus improved. He still tends to avoid activities that require longer focus, disorganization, struggling to follow instructions/listen, forgetfulness, poor follow through on tasks.  His weight loss was somewhat intentional - lost 25 lbs in past 5 months, as it helped his blood pressure for which he no longer needs to take medication. Mike Richardson denied history of drug or alcohol abuse. He has met with Chryl Heck on 09/08/19 and will continue in individual counseling.  Dx: Major depressive disorder recurrent, moderate; ADHD; susp. Insomnia shift work type.  Plan: We will increase dose of sertraline to 50 mg and he will take it after waking up. I will add trazodone 50-100 mg for insomnia. Once his mood improved we may consider restarting methylphenidate for residual sx of ADHD. He stopped going to work last Friday (day he went to ED) but plans to return to work tonight. He will need to to have FMLA forms filled out. The plan was discussed with patient who had an opportunity to ask questions and these were all answered. I spend 45 minutes in video clinical contact with the patient and devoted approximately 50% of this time to explanation of diagnosis, discussion of treatment options and med education.   Stephanie Acre, MD 7/21/20219:33 AM

## 2019-09-15 ENCOUNTER — Ambulatory Visit (INDEPENDENT_AMBULATORY_CARE_PROVIDER_SITE_OTHER): Payer: BC Managed Care – PPO | Admitting: Licensed Clinical Social Worker

## 2019-09-15 ENCOUNTER — Other Ambulatory Visit: Payer: Self-pay

## 2019-09-15 DIAGNOSIS — F332 Major depressive disorder, recurrent severe without psychotic features: Secondary | ICD-10-CM | POA: Diagnosis not present

## 2019-09-15 NOTE — Progress Notes (Signed)
Virtual Visit via Video Note   Mike connected with Mike Richardson on 09/15/19 at 2:00pm by video enabled telemedicine application and verified that Mike am speaking with the correct person using two identifiers.   Mike discussed the limitations, risks, security and privacy concerns of performing an evaluation and management service by telephone and the availability of in person appointments. Mike Richardson discussed with the patient that there may be a patient responsible charge related to this service. The patient expressed understanding and agreed to proceed.   Mike discussed the assessment and treatment plan with the patient. The patient was provided an opportunity to ask questions and all were answered. The patient agreed with the plan and demonstrated an understanding of the instructions.   The patient was advised to call back or seek an in-person evaluation if the symptoms worsen or if the condition fails to improve as anticipated.   Mike provided 1 hour of non-face-to-face time during this encounter.     Shade Flood, LCSW, LCAS _______________________ THERAPIST PROGRESS NOTE   Session Time: 2:00pm - 3:00pm  Location: Patient: Patient Home Provider: [place] Office    Participation Level: Active    Behavioral Response: Alert, casually dressed, well groomed, euthymic mood/affect   Type of Therapy:  Individual Therapy   Treatment Goals addressed: Psychiatry appointment; Depression management; Exercise/diet; Employment; Family socialization; Sleep hygiene   Interventions: CBT, positive affirmations, solution focused and strengths based   Summary: Mike Richardson is a 39 year old divorced African American male that presented for virtual therapy appointment today with diagnosis of Major Depressive Disorder, recurrent, severe.      Suicidal/Homicidal: None; without plan or intent    Therapist Response:  Clinician met with Mike Richardson for virtual appointment and assessed for safety, sobriety, and  medication compliance.  Mike Richardson presented for session on time and was alert, oriented x5, with no evidence or self-report of SI/HI or A/V H.  Mike Richardson reported that he successfully attended appointment with new psychiatrist last week and had antidepressant dose increased as a result, as well as receiving prescription trazodone to help with sleep issues.  He denied any abuse of alcohol or illicit substances at this time.  Clinician inquired about Mike Richardson's current emotional ratings, as well as any significant changes in thoughts, feelings, or behavior since completion of assessment.  Mike Richardson reported score of 0/10 for depression today and stated "Mike've been feeling great lately".  Clinician inquired about progress that Mike Richardson has made towards goals in past week, as well as present barriers to success. Mike Richardson reported that since having his antidepressant increased, he has felt more motivated and able to handle challenges each day without feeling 'stuck'.  He reported that he is completing shifts at work without issue, and productivity overall is up.  Mike Richardson reported that he spent the weekend reconnecting with his daughter by helping with chores, cooking, and driving around, as well as watching movies with his fianc, stating "It felt good to be able to do simple things with them".  Mike Richardson reported that he spoke with his fianc about his exercise goal, and she would like to join him, so they will plan to start a mutual routine at the gym together next week.  Mike Richardson reported that one area he has yet to focus on is setting aside time for self-care.  Mike Richardson Richardson reported that his confidence started its decline when his depression surfaced, so he would like to engage in activities that address low self-esteem.  Clinician discussed basic  tenets of cognitive behavioral therapy with Mike Richardson today, including the link between thoughts, feelings, and behavior, and the impact that automatic negative thoughts can have on  self-image and outlook.  Mike Richardson reported that during period of depression, he tends to ruminate on the thought that he is alone or by himself, which makes him feel irritable, angry, and resentful, which can influence his behavior significantly, leading him to lash out when he can no longer compartmentalize negative feelings, and then isolate himself afterward from supports.  Clinician explored positive activities with Mike Richardson that would increase support, sense of purpose, and highlight personal strengths.  Mike Richardson reported that he has toyed with the idea of volunteering as a Careers adviser for youth as a meaningful pastime due to his previous experience and knowledge, so clinician searched for available spots in local area online, including a flag football team.  Clinician Richardson explained importance of confronting automatic negative thoughts to reduce influence, as well as positive affirmations that could be substituted to negate impact.  Interventions were effective, as evidenced by Mike Richardson reporting that todays session helped him identify steps he can begin taking this week to improve confidence, along with increasing understanding basics of CBT and its role in his treatment.  Mike Richardson expressed openness to researching flag football volunteer work in [place], as well as practicing positive affirmations such as "Mike'm not alone because Mike have my children and family to support me", "Mike am an unstoppable force of Mike Richardson" and "Mike am having a positive and inspiring impact on the people Mike come into contact with".  Clinician will continue to monitor.                                        Plan: Follow up again in 1 week virtually.    Diagnosis: Major depressive disorder, recurrent, severe    Shade Flood, [place], LCAS 09/15/19

## 2019-09-24 ENCOUNTER — Other Ambulatory Visit: Payer: Self-pay

## 2019-09-24 ENCOUNTER — Ambulatory Visit (INDEPENDENT_AMBULATORY_CARE_PROVIDER_SITE_OTHER): Payer: BC Managed Care – PPO | Admitting: Licensed Clinical Social Worker

## 2019-09-24 DIAGNOSIS — F332 Major depressive disorder, recurrent severe without psychotic features: Secondary | ICD-10-CM

## 2019-09-24 NOTE — Progress Notes (Signed)
Virtual Visit via Video Note   I connected with Mike Richardson on 09/24/19 at 2:00pm by video enabled telemedicine application and verified that I am speaking with the correct person using two identifiers.   I discussed the limitations, risks, security and privacy concerns of performing an evaluation and management service by telephone and the availability of in person appointments. I also discussed with the patient that there may be a patient responsible charge related to this service. The patient expressed understanding and agreed to proceed.   I discussed the assessment and treatment plan with the patient. The patient was provided an opportunity to ask questions and all were answered. The patient agreed with the plan and demonstrated an understanding of the instructions.   The patient was advised to call back or seek an in-person evaluation if the symptoms worsen or if the condition fails to improve as anticipated.   I provided 45 minutes of non-face-to-face time during this encounter.     Shade Flood, LCSW, LCAS _______________________ THERAPIST PROGRESS NOTE   Session Time: 2:00pm - 2:45pm   Location: Patient: Patient Home Provider: [place] Office    Participation Level: Active    Behavioral Response: Alert, casually dressed, well groomed, depressed mood/affect   Type of Therapy:  Individual Therapy   Treatment Goals addressed: Depression management; Family socialization   Interventions: CBT, problem solving, positive affirmations   Summary: Mike Richardson is a 39 year old divorced African American male that presented for virtual therapy appointment today with diagnosis of Major Depressive Disorder, recurrent, severe.      Suicidal/Homicidal: None; without plan or intent    Therapist Response:  Clinician met with Mike Richardson for virtual session and assessed for safety, sobriety, and medication compliance.  Mike Richardson presented for session on time and was alert, oriented x5,  with no evidence or self-report of SI/HI or A/V H.  Mike Richardson reported that he remains compliant with medications and denied any abuse of alcohol or illicit substances.  Clinician inquired about Mike Richardson emotional ratings today, as well as any significant changes in thoughts, feelings, or behavior since last check-in.  Mike Richardson reported score of 0/10 for both depression and anxiety today and initially stated that "Things have been pretty good". Mike Richardson then disclosed that he and his fianc had an argument the other day which escalated, and now she is being distant.  Clinician discussed the events preceding this argument with Mike Richardson and his feelings on the matter, including his struggle with getting her to trust him.  Clinician discussed strategies for improving communication and trust within the relationship, and possibility of exploring couple's counseling if they are unable to resolve issues on their own.  Mike Richardson reported that he would be open to this as a last resort, as he tried to speak with her again the other day, but was blown off, leaving him feeling disappointed in himself and emotionally hurt.  Clinician revisited topic of positive affirmations with Mike Richardson to balance negative thinking tied into present state of his relationship.  Interventions were effective, as evidenced by Mike Richardson reporting that he was glad to get the chance to process his thoughts and feelings today on the situation and weigh out his next best steps to reconnect with fianc, stating several related affirmations such as "I'm human and make mistakes", and "Everything will work out for the best".  Clinician will continue to monitor.  Plan: Follow up again in 1 week virtually.    Diagnosis: Major depressive disorder, recurrent, severe    Shade Flood, [place], LCAS 09/24/19

## 2019-09-29 ENCOUNTER — Ambulatory Visit (INDEPENDENT_AMBULATORY_CARE_PROVIDER_SITE_OTHER): Payer: BC Managed Care – PPO | Admitting: Licensed Clinical Social Worker

## 2019-09-29 ENCOUNTER — Other Ambulatory Visit: Payer: Self-pay

## 2019-09-29 DIAGNOSIS — F332 Major depressive disorder, recurrent severe without psychotic features: Secondary | ICD-10-CM

## 2019-09-29 NOTE — Progress Notes (Signed)
Virtual Visit via Video Note   I connected with Mike Richardson on 09/29/19 at 2:00pm by video enabled telemedicine application and verified that I am speaking with the correct person using two identifiers.   I discussed the limitations, risks, security and privacy concerns of performing an evaluation and management service by telephone and the availability of in person appointments. I also discussed with the patient that there may be a patient responsible charge related to this service. The patient expressed understanding and agreed to proceed.   I discussed the assessment and treatment plan with the patient. The patient was provided an opportunity to ask questions and all were answered. The patient agreed with the plan and demonstrated an understanding of the instructions.   The patient was advised to call back or seek an in-person evaluation if the symptoms worsen or if the condition fails to improve as anticipated.   I provided 45 minutes of non-face-to-face time during this encounter.     Shade Flood, LCSW, LCAS _______________________ THERAPIST PROGRESS NOTE   Session Time: 2:00pm - 2:45pm    Location: Patient: Patient Home Provider: [place] Office    Participation Level: Active    Behavioral Response: Alert, casually dressed, well groomed, euthymic mood/affect   Type of Therapy:  Individual Therapy   Treatment Goals addressed: Anxiety/Depression management; Family socialization; Employment    Interventions: CBT, mindfulness meditation    Summary: Mike Richardson is a 39 year old divorced African American male that presented for virtual therapy appointment today with diagnosis of Major Depressive Disorder, recurrent, severe.      Suicidal/Homicidal: None; without plan or intent    Therapist Response:  Clinician met with Mike Richardson for virtual appointment and assessed for safety, sobriety, and medication compliance.  Mike Richardson presented for appointment on time and was alert,  oriented x5, with no evidence or self-report of SI/HI or A/V H.  Mike Richardson reported ongoing compliance with medication and denied any abuse of alcohol or illicit substances.  Clinician inquired about Mike Richardson current emotional ratings, as well as any significant changes in thoughts, feelings, or behavior since previous check-in.  Corney reported score of 0/10 for both depression and anxiety today, which is consistent with previous session.  Mike Richardson reported that he has maintained improved mood, and recently transitioned to new work schedule, which should offer more time for self-care, in addition to engagement with his family and fianc.  Mike Richardson reported that his energy and motivation is up, so coworkers at his job have responded positively too. Mike Richardson reported some anxiety related to FMLA process with work, and clinician assisted in [place] about clarification through psychiatry on approved dates.  Clinician also offered to teach Mike Richardson mindfulness technique as a coping skill to avoid rumination on anxious thoughts, which Mike Richardson was agreeable to.  Clinician guided Mike Richardson through process of getting comfortable, achieving relaxed breathing pattern, and then focusing on this rhythm for 10 minutes while allowing troubling thoughts or feelings that arose to be acknowledged, but not ruminated upon to ensure present mindedness.  Interventions were effective, as evidenced by Markevion participating in activity successfully and reporting that the exercise left him feeling more calm and at ease.  Mike Richardson reported that his mind was clear upon completing meditation, and he would consider adding this to self-care routine.  Clinician will continue to monitor.  Plan: Follow up again in 1 week virtually.    Diagnosis: Major depressive disorder, recurrent, severe    Shade Flood, [place], LCAS 09/29/19

## 2019-10-08 ENCOUNTER — Telehealth (HOSPITAL_COMMUNITY): Payer: Self-pay | Admitting: Licensed Clinical Social Worker

## 2019-10-08 ENCOUNTER — Ambulatory Visit (HOSPITAL_COMMUNITY): Payer: BC Managed Care – PPO | Admitting: Licensed Clinical Social Worker

## 2019-10-08 ENCOUNTER — Other Ambulatory Visit: Payer: Self-pay

## 2019-10-08 NOTE — Telephone Encounter (Signed)
Mike Richardson had a virtual therapy session scheduled today at [place] did not present on time as expected, so clinician outreached him by phone at 2:05pm.  A voicemail could not be left due to mailbox being inactive. [name] also doesn't have active email listed for additional outreach at this time, so clinician waited 10 more minutes for [name] to present to session following this phone call before ending session. Clinician informed front desk staff of no-show event.    Shade Flood, LCSW, LCAS 10/08/19

## 2019-10-10 ENCOUNTER — Other Ambulatory Visit: Payer: Self-pay

## 2019-10-10 ENCOUNTER — Telehealth (HOSPITAL_COMMUNITY): Payer: BC Managed Care – PPO | Admitting: Psychiatry

## 2019-10-13 ENCOUNTER — Other Ambulatory Visit: Payer: Self-pay

## 2019-10-13 ENCOUNTER — Telehealth (INDEPENDENT_AMBULATORY_CARE_PROVIDER_SITE_OTHER): Payer: BC Managed Care – PPO | Admitting: Licensed Clinical Social Worker

## 2019-10-13 ENCOUNTER — Ambulatory Visit (HOSPITAL_COMMUNITY): Payer: BC Managed Care – PPO | Admitting: Licensed Clinical Social Worker

## 2019-10-13 NOTE — Telephone Encounter (Signed)
Desman had a virtual therapy appointment scheduled this morning for 11am.  Anzel did not present on time as expected, so clinician outreached him by phone at 11:05am.  A voicemail could not be left at this time due to mailbox remaining inactive. Mahonri also doesn't have active email listed for additional outreach at this time, so clinician waited 10 more minutes for Alexande to present to appointment following this phone outreach before closing virtual session. Clinician informed front desk staff that this is his second consecutive no-show event.    Shade Flood, [place], LCAS 10/13/19

## 2019-10-20 MED FILL — Sertraline HCl Tab 50 MG: ORAL | Qty: 25 | Status: AC

## 2019-10-22 ENCOUNTER — Ambulatory Visit (HOSPITAL_COMMUNITY): Payer: BC Managed Care – PPO | Admitting: Licensed Clinical Social Worker

## 2019-10-22 ENCOUNTER — Telehealth (HOSPITAL_COMMUNITY): Payer: Self-pay | Admitting: Licensed Clinical Social Worker

## 2019-10-22 ENCOUNTER — Other Ambulatory Visit: Payer: Self-pay

## 2019-10-22 NOTE — Telephone Encounter (Signed)
Mike Richardson had a virtual therapy appointment scheduled this afternoon at 2pm.  Mike Richardson did not present on time as expected, so clinician outreached him by phone at 2:08pm.  A voicemail could not be left at this time due to mailbox remaining inactive. Mike Richardson has no active email listed for additional outreach at this time either, so clinician waited until 2:15pm for Mike Richardson to present to appointment following this phone outreach before ending virtual appointment. Clinician informed front desk staff that this is Mike Richardson's third consecutive no-show event and he will need to be discharged due to ongoing lack of engagement.    Shade Flood, LCSW, LCAS 10/22/19

## 2021-06-19 IMAGING — CR DG CHEST 2V
2 series · 2 of 2 positions shown · non-contrast
Comparison: None.

CLINICAL DATA: Chest pain

EXAM:
CHEST - 2 VIEW

[chest pa]
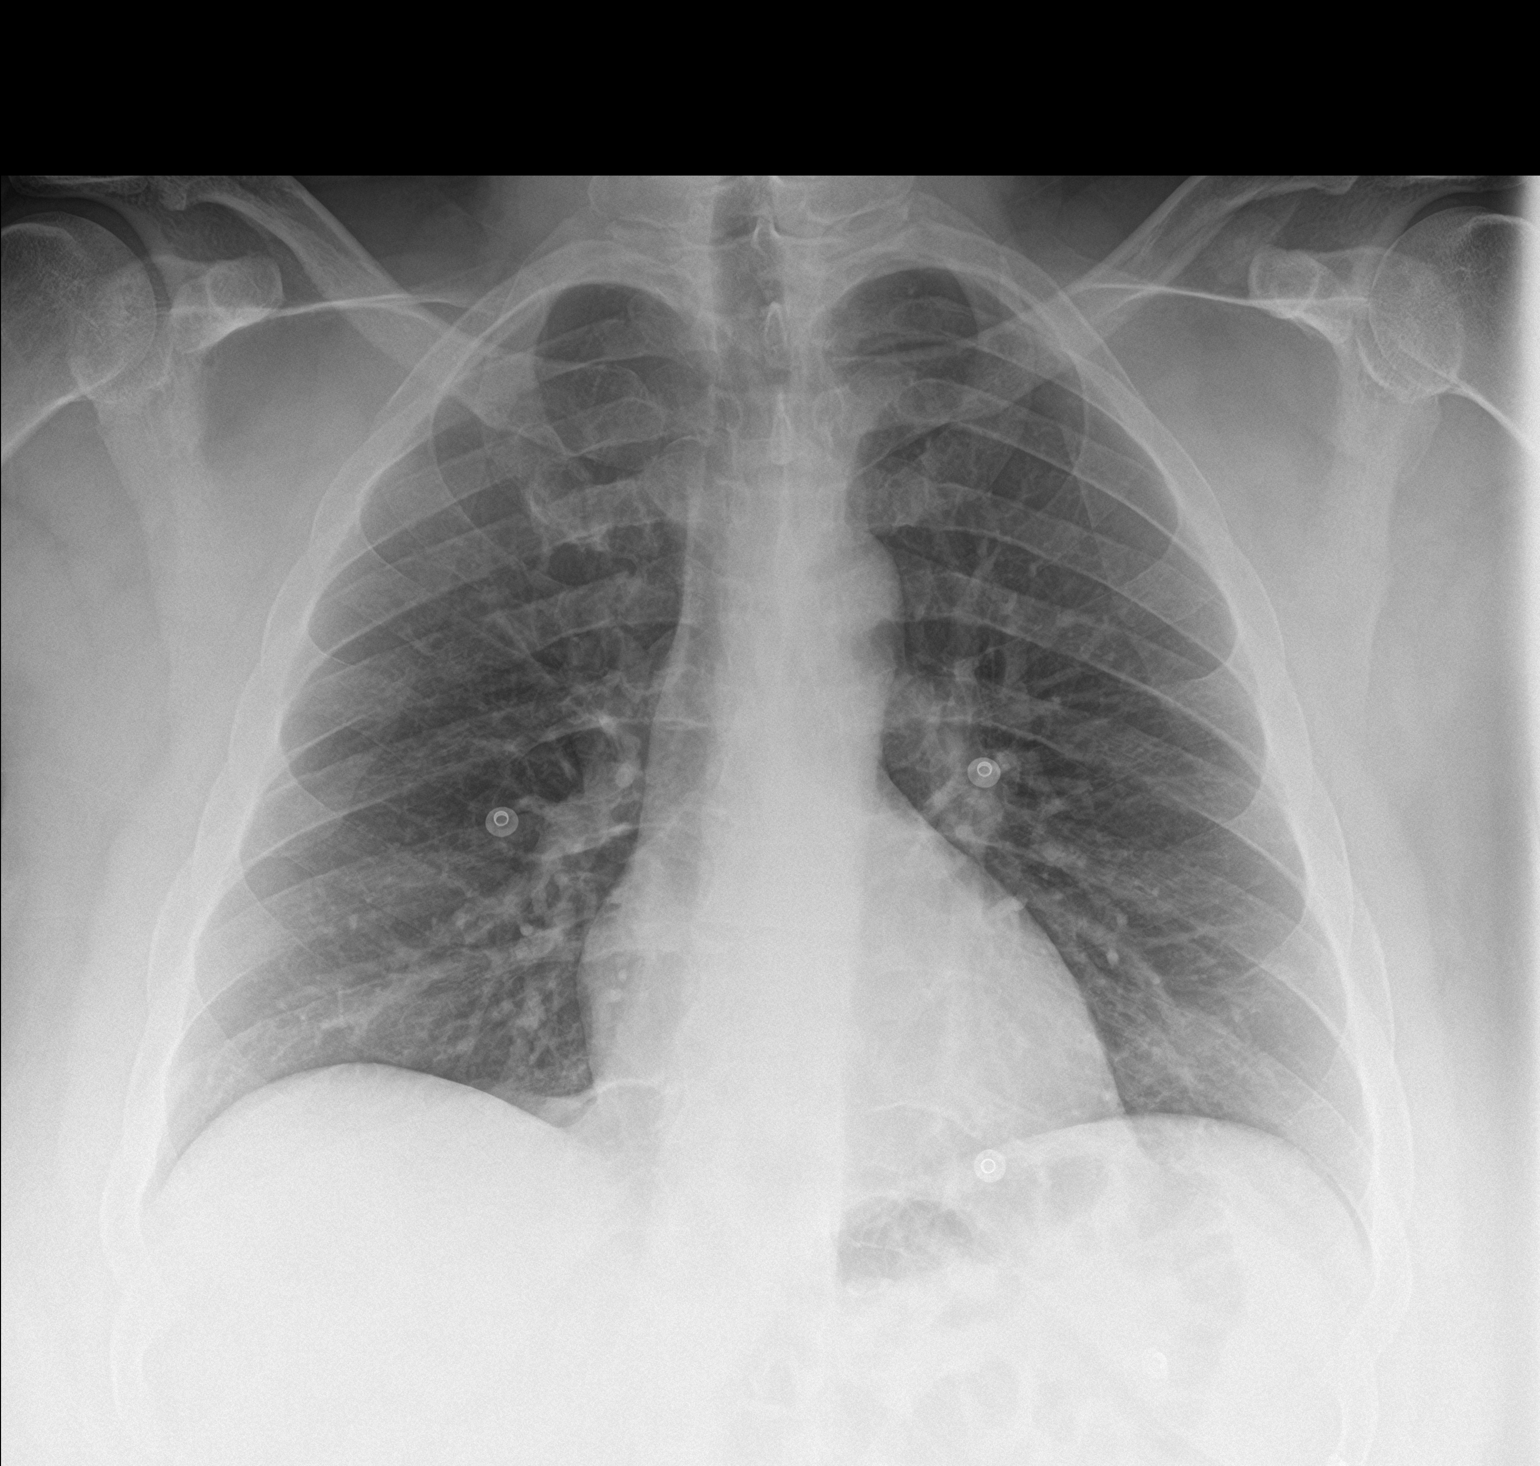

[chest lat]
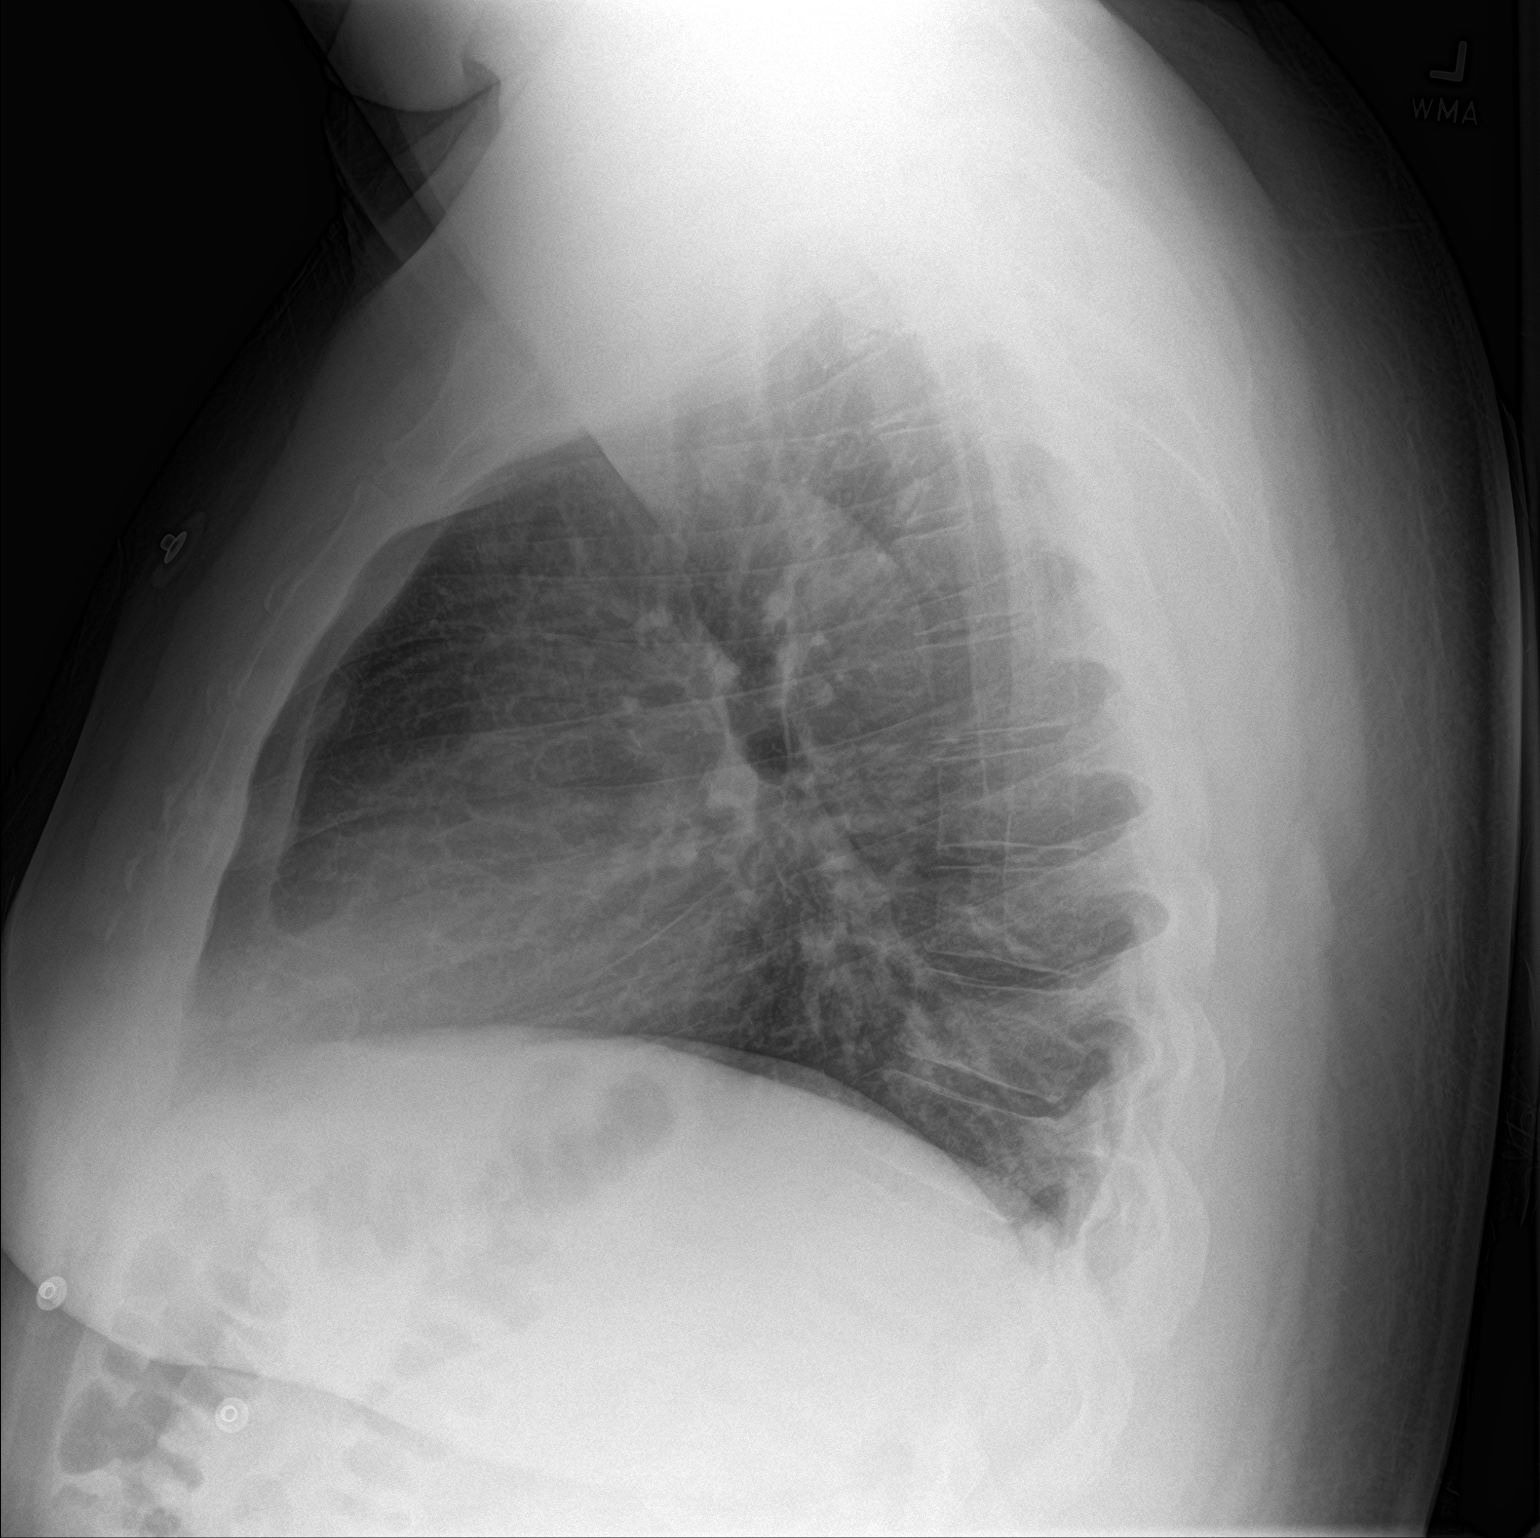

[2 of 2 positions shown; findings below may reference images not displayed]

FINDINGS: The heart size and mediastinal contours are within normal limits.
Both lungs are clear. The visualized skeletal structures are
unremarkable.
IMPRESSION: No acute abnormality of the lungs.

## 2021-08-20 ENCOUNTER — Ambulatory Visit
Admission: EM | Admit: 2021-08-20 | Discharge: 2021-08-20 | Disposition: A | Discharge status: Home / Self Care | Payer: BC Managed Care – PPO | Attending: Internal Medicine | Admitting: Internal Medicine

## 2021-08-20 DIAGNOSIS — G43019 Migraine without aura, intractable, without status migrainosus: Secondary | ICD-10-CM

## 2021-08-20 DIAGNOSIS — D1723 Benign lipomatous neoplasm of skin and subcutaneous tissue of right leg: Secondary | ICD-10-CM

## 2021-08-20 MED ORDER — DEXAMETHASONE SODIUM PHOSPHATE 10 MG/ML IJ SOLN
10.0000 mg | Freq: Once | INTRAMUSCULAR | Status: AC
  Administered 2021-08-20: 10 mg via INTRAMUSCULAR

## 2021-08-20 MED ORDER — KETOROLAC TROMETHAMINE 30 MG/ML IJ SOLN
30.0000 mg | Freq: Once | INTRAMUSCULAR | Status: AC
  Administered 2021-08-20: 30 mg via INTRAMUSCULAR

## 2021-08-20 NOTE — ED Triage Notes (Signed)
Patient presents to Urgent Care with complaints of ha, since 3 days ago. Patient reports he has not taken any otc medications, pt reports his so took his bp yesterday on his wrist and it was high, pt reports he is concerned this could be causing it, no pcp  Pt also expresses lump on shin of right leg he noticed one month ago, no pain.

## 2021-08-20 NOTE — ED Provider Notes (Signed)
Mike Richardson    CSN: [id number] Arrival date & time: 08/20/21  1324      History   Chief Complaint Chief Complaint  Patient presents with   Headache    HPI Mike Richardson is a 41 y.o. male.   Patient presents with 2 different chief complaints today.  He presents with a headache that has been present for approximately 2 days.  He reports that the headache is rated 8/10 on pain scale and is located on the left upper portion of his head.  He does report history of migraines in the past but denies a formal diagnosis.  He is concerned for high blood pressure as he took his blood pressure at home with a wrist blood pressure cuff and got 401 systolic last night.  He took hydrochlorothiazide approximately 6 months ago but stopped taking it himself and has not seen a PCP since.  Denies any associated dizziness, chest pain, shortness of breath, blurry vision, nausea, vomiting.  Patient denies that this is the worst headache of his life.  Denies any recent head injury. He has not taken any medication for headache.  Patient also reporting a "lump" on the outer portion of the right leg that he noticed about a month ago.  Denies any pain to the area.  Denies any injury to the area.  Denies any numbness or tingling.  Denies any drainage from the area.  Denies any insect or spider bite.   Headache   Past Medical History:  Diagnosis Date   Hypertension     Patient Active Problem List   Diagnosis Date Noted   MDD (major depressive disorder), recurrent episode, moderate ([place]) 09/05/2019    History reviewed. No pertinent surgical history.     Home Medications    Prior to Admission medications   Medication Sig Start Date End Date Taking? Authorizing Provider  sertraline (ZOLOFT) 50 MG tablet Take 1 tablet (50 mg total) by mouth daily. Patient not taking: Reported on 08/20/2021 09/10/19 10/10/19  Pucilowski, Marchia Bond, MD  traZODone (DESYREL) 100 MG tablet Take 1 tablet (100  mg total) by mouth at bedtime as needed for sleep. Patient not taking: Reported on 08/20/2021 09/10/19 10/10/19  Pucilowski, Marchia Bond, MD    Family History History reviewed. No pertinent family history.  Social History Social History   Tobacco Use   Smoking status: Never   Smokeless tobacco: Never  Vaping Use   Vaping Use: Never used  Substance Use Topics   Drug use: Never     Allergies   Patient has no known allergies.   Review of Systems Review of Systems Per HPI  Physical Exam Triage Vital Signs ED Triage Vitals  Enc Vitals Group     BP 08/20/21 0843 (!) 138/97     Pulse Rate 08/20/21 0843 84     Resp 08/20/21 0843 15     Temp 08/20/21 0843 97.8 F (36.6 C)     Temp src --      SpO2 08/20/21 0843 96 %     Weight --      Height --      Head Circumference --      Peak Flow --      Pain Score 08/20/21 0842 8     Pain Loc --      Pain Edu? --      Excl. in [place]? --    No data found.  Updated Vital Signs BP (!) 138/97   Pulse  84   Temp 97.8 F (36.6 C)   Resp 15   SpO2 96%   Visual Acuity Right Eye Distance:   Left Eye Distance:   Bilateral Distance:    Right Eye Near:   Left Eye Near:    Bilateral Near:     Physical Exam Constitutional:      General: He is not in acute distress.    Appearance: Normal appearance. He is not toxic-appearing or diaphoretic.  HENT:     Head: Normocephalic and atraumatic.  Eyes:     Extraocular Movements: Extraocular movements intact.     Conjunctiva/sclera: Conjunctivae normal.     Pupils: Pupils are equal, round, and reactive to light.  Cardiovascular:     Rate and Rhythm: Normal rate and regular rhythm.     Pulses: Normal pulses.     Heart sounds: Normal heart sounds.  Pulmonary:     Effort: Pulmonary effort is normal. No respiratory distress.     Breath sounds: Normal breath sounds.  Abdominal:     General: Bowel sounds are normal. There is no distension.     Palpations: Abdomen is soft.     Tenderness:  There is no abdominal tenderness.  Skin:    Comments: Approximately 2 to 2.5 cm in diameter circular spongy area to lateral portion of right lower extremity.  No discoloration or tenderness to palpation.  No discharge noted.  Neurological:     General: No focal deficit present.     Mental Status: He is alert and oriented to person, place, and time. Mental status is at baseline.     Cranial Nerves: Cranial nerves 2-12 are intact.     Sensory: Sensation is intact.     Motor: Motor function is intact.     Coordination: Coordination is intact.     Gait: Gait is intact.  Psychiatric:        Mood and Affect: Mood normal.        Behavior: Behavior normal.        Thought Content: Thought content normal.        Judgment: Judgment normal.      UC Treatments / Results  Labs (all labs ordered are listed, but only abnormal results are displayed) Labs Reviewed - No data to display  EKG   Radiology No results found.  Procedures Procedures (including critical Richardson time)  Medications Ordered in UC Medications  ketorolac (TORADOL) 30 MG/ML injection 30 mg (30 mg Intramuscular Given 08/20/21 0915)  dexamethasone (DECADRON) injection 10 mg (10 mg Intramuscular Given 08/20/21 0915)    Initial Impression / Assessment and Plan / UC Course  I have reviewed the triage vital signs and the nursing notes.  Pertinent labs & imaging results that were available during my Richardson of the patient were reviewed by me and considered in my medical decision making (see chart for details).     Patient's blood pressure is 694 systolic on initial triage.  Recheck was 131/97.  Do not think that patient's blood pressure is contributing to headache and I assume that patient's blood pressure cuff may not be accurate.  Advised patient to get a blood pressure cuff that is taken on the top portion of the arm and to take consistently at home and record.  Patient advised to follow-up with PCP for further evaluation and  management and concerns of high blood pressure.  He was given strict return and ER precautions regarding blood pressure measurements.  Patient appears to have a migraine  headache and states that this feels similar to his migraines in the past.  No associated symptoms, neuro exam is normal, no signs of endorgan damage so do not think that emergent evaluation in the hospital or CT imaging of the head is necessary.  Will treat with migraine cocktail today.  Patient advised to avoid NSAIDs for at least 24 hours following injections.  Advised patient to go the ER if symptoms do not improve or if they worsen in the next 24 to 48 hours.  Area to leg appears to be lipoma versus cyst.  Advised patient that he will need an ultrasound to confirm.  Unable to order ultrasound in urgent Richardson so patient was encouraged to follow-up with PCP for this.  Patient was given strict return and ER precautions for all chief complaints that.  Patient verbalized understanding and was agreeable with plan. Final Clinical Impressions(s) / UC Diagnoses   Final diagnoses:  Intractable migraine without aura and without status migrainosus  Lipoma of right lower extremity     Discharge Instructions      You were given 2 injections today in urgent Richardson for migraine.  Please avoid taking any ibuprofen, Advil, Aleve for at least 24 hours following injection.  Please go to the emergency department if no improvement in headache or if it worsens in the next 24 to 48 hours.  Please follow-up with primary Richardson doctor for further evaluation and management of your concern for blood pressure as well as the lump on your leg as it may need ultrasound.    ED Prescriptions   None    PDMP not reviewed this encounter.   Teodora Medici, [place] 08/20/21 [phone]

## 2021-08-20 NOTE — Discharge Instructions (Addendum)
You were given 2 injections today in urgent care for migraine.  Please avoid taking any ibuprofen, Advil, Aleve for at least 24 hours following injection.  Please go to the emergency department if no improvement in headache or if it worsens in the next 24 to 48 hours.  Please follow-up with primary care doctor for further evaluation and management of your concern for blood pressure as well as the lump on your leg as it may need ultrasound.

## 2021-08-26 ENCOUNTER — Emergency Department (HOSPITAL_COMMUNITY)
Admission: EM | Admit: 2021-08-26 | Discharge: 2021-08-27 | Disposition: A | Discharge status: Home / Self Care | Payer: BC Managed Care – PPO | Attending: Emergency Medicine | Admitting: Emergency Medicine

## 2021-08-26 ENCOUNTER — Emergency Department (HOSPITAL_COMMUNITY): Payer: BC Managed Care – PPO

## 2021-08-26 ENCOUNTER — Encounter (HOSPITAL_COMMUNITY): Payer: Self-pay

## 2021-08-26 ENCOUNTER — Other Ambulatory Visit: Payer: Self-pay

## 2021-08-26 DIAGNOSIS — I10 Essential (primary) hypertension: Secondary | ICD-10-CM | POA: Diagnosis not present

## 2021-08-26 DIAGNOSIS — R45851 Suicidal ideations: Secondary | ICD-10-CM | POA: Diagnosis not present

## 2021-08-26 DIAGNOSIS — R0789 Other chest pain: Secondary | ICD-10-CM | POA: Diagnosis not present

## 2021-08-26 DIAGNOSIS — Z20822 Contact with and (suspected) exposure to covid-19: Secondary | ICD-10-CM | POA: Insufficient documentation

## 2021-08-26 DIAGNOSIS — R079 Chest pain, unspecified: Secondary | ICD-10-CM

## 2021-08-26 DIAGNOSIS — F331 Major depressive disorder, recurrent, moderate: Secondary | ICD-10-CM | POA: Diagnosis present

## 2021-08-26 LAB — CBC WITH DIFFERENTIAL/PLATELET
Abs Immature Granulocytes: 0.01 10*3/uL (ref 0.00–0.07)
Basophils Absolute: 0 10*3/uL (ref 0.0–0.1)
Basophils Relative: 1 %
Eosinophils Absolute: 0 10*3/uL (ref 0.0–0.5)
Eosinophils Relative: 0 %
HCT: 41.1 % (ref 39.0–52.0)
Hemoglobin: 14 g/dL (ref 13.0–17.0)
Immature Granulocytes: 0 %
Lymphocytes Relative: 23 %
Lymphs Abs: 1.4 10*3/uL (ref 0.7–4.0)
MCH: 34.2 pg — ABNORMAL HIGH (ref 26.0–34.0)
MCHC: 34.1 g/dL (ref 30.0–36.0)
MCV: 100.5 fL — ABNORMAL HIGH (ref 80.0–100.0)
Monocytes Absolute: 0.7 10*3/uL (ref 0.1–1.0)
Monocytes Relative: 11 %
Neutro Abs: 4 10*3/uL (ref 1.7–7.7)
Neutrophils Relative %: 65 %
Platelets: 197 10*3/uL (ref 150–400)
RBC: 4.09 MIL/uL — ABNORMAL LOW (ref 4.22–5.81)
RDW: 12.4 % (ref 11.5–15.5)
WBC: 6.1 10*3/uL (ref 4.0–10.5)
nRBC: 0 % (ref 0.0–0.2)

## 2021-08-26 LAB — COMPREHENSIVE METABOLIC PANEL
ALT: 32 U/L (ref 0–44)
AST: 24 U/L (ref 15–41)
Albumin: 4.2 g/dL (ref 3.5–5.0)
Alkaline Phosphatase: 50 U/L (ref 38–126)
Anion gap: 9 (ref 5–15)
BUN: 10 mg/dL (ref 6–20)
CO2: 24 mmol/L (ref 22–32)
Calcium: 9.1 mg/dL (ref 8.9–10.3)
Chloride: 111 mmol/L (ref 98–111)
Creatinine, Ser: 0.81 mg/dL (ref 0.61–1.24)
GFR, Estimated: >60 mL/min (ref >60)
Glucose, Bld: 104 mg/dL — ABNORMAL HIGH (ref 70–99)
Potassium: 3.7 mmol/L (ref 3.5–5.1)
Sodium: 144 mmol/L (ref 135–145)
Total Bilirubin: 1 mg/dL (ref 0.3–1.2)
Total Protein: 7.9 g/dL (ref 6.5–8.1)

## 2021-08-26 LAB — RAPID URINE DRUG SCREEN, HOSP PERFORMED
Amphetamines: NOT DETECTED
Barbiturates: NOT DETECTED
Benzodiazepines: NOT DETECTED
Cocaine: NOT DETECTED
Opiates: NOT DETECTED
Tetrahydrocannabinol: NOT DETECTED

## 2021-08-26 LAB — TROPONIN I (HIGH SENSITIVITY)
Troponin I (High Sensitivity): 9 ng/L (ref <18)
Troponin I (High Sensitivity): 10 ng/L (ref <18)

## 2021-08-26 LAB — SARS CORONAVIRUS 2 BY RT PCR: SARS Coronavirus 2 by RT PCR: NEGATIVE

## 2021-08-26 LAB — ACETAMINOPHEN LEVEL: Acetaminophen (Tylenol), Serum: <10 ug/mL — ABNORMAL LOW (ref 10–30)

## 2021-08-26 LAB — SALICYLATE LEVEL: Salicylate Lvl: <7 mg/dL — ABNORMAL LOW (ref 7.0–30.0)

## 2021-08-26 LAB — ETHANOL: Alcohol, Ethyl (B): <10 mg/dL (ref <10)

## 2021-08-26 NOTE — ED Notes (Signed)
Pt belongings placed in nurse's station cabinet 9-25 and [address].

## 2021-08-26 NOTE — ED Provider Notes (Signed)
[facility] DEPT Provider Note   CSN: [id number] Arrival date & time: 08/26/21  1656     History  Chief Complaint  Patient presents with   Chest Pain   Suicidal    Fayez Antwan Rizzo is a 41 y.o. male.   Chest Pain Associated symptoms: no abdominal pain, no back pain, no cough, no fever, no palpitations, no shortness of breath and no vomiting    41 year old male presents emergency department with complaints of chest pain and suicidal ideation.  Patient states that chest pain began after he got home from work this morning.  It is described as pressure/heaviness is located on the left side of his chest without radiation.  He notes increasing pain with activity.  He has not taken anything for it since it is recurrent.  Denies symptoms similar in the past.  He also complains of suicidal ideation.  He notes similar episode in the past approximately 2 to 3 years ago.  He does not have a plan to harm himself but states the thought is in his mind.  Thought occurred after he got home for work.  He has had prior suicidal attempt.  Denies fever, chills, night sweats, shortness of breath, abdominal pain, nausea/vomiting/diarrhea, urinary symptoms, change in bowel habits.  Denies homicidal ideation.  Home Medications Prior to Admission medications   Medication Sig Start Date End Date Taking? Authorizing Provider  sertraline (ZOLOFT) 50 MG tablet Take 1 tablet (50 mg total) by mouth daily. Patient not taking: Reported on 08/20/2021 09/10/19 10/10/19  Pucilowski, Marchia Bond, MD  traZODone (DESYREL) 100 MG tablet Take 1 tablet (100 mg total) by mouth at bedtime as needed for sleep. Patient not taking: Reported on 08/20/2021 09/10/19 10/10/19  Pucilowski, Marchia Bond, MD      Allergies    Patient has no known allergies.    Review of Systems   Review of Systems  Constitutional:  Negative for chills and fever.  HENT:  Negative for ear pain and sore throat.   Eyes:   Negative for pain and visual disturbance.  Respiratory:  Negative for cough and shortness of breath.   Cardiovascular:  Positive for chest pain. Negative for palpitations.  Gastrointestinal:  Negative for abdominal pain and vomiting.  Genitourinary:  Negative for dysuria and hematuria.  Musculoskeletal:  Negative for arthralgias and back pain.  Skin:  Negative for color change and rash.  Neurological:  Negative for seizures and syncope.  Psychiatric/Behavioral:  Positive for suicidal ideas.   All other systems reviewed and are negative.   Physical Exam Updated Vital Signs BP (!) 159/99   Pulse 74   Temp 98 F (36.7 C) (Oral)   Resp 18   Ht '6\' 2"'$  (1.88 m)   Wt (!) 145.2 kg   SpO2 99%   BMI 41.09 kg/m  Physical Exam Vitals and nursing note reviewed.  Constitutional:      General: He is not in acute distress.    Appearance: He is well-developed.  HENT:     Head: Normocephalic and atraumatic.  Eyes:     Extraocular Movements: Extraocular movements intact.     Conjunctiva/sclera: Conjunctivae normal.  Cardiovascular:     Rate and Rhythm: Normal rate and regular rhythm.     Heart sounds: No murmur heard. Pulmonary:     Effort: Pulmonary effort is normal. No respiratory distress.     Breath sounds: Normal breath sounds. No wheezing, rhonchi or rales.  Chest:     Comments: No  tenderness to palpation along chest wall. Abdominal:     Palpations: Abdomen is soft.     Tenderness: There is no abdominal tenderness.  Musculoskeletal:        General: No swelling.     Cervical back: Neck supple.     Right lower leg: No tenderness. No edema.     Left lower leg: No tenderness. No edema.  Skin:    General: Skin is warm and dry.     Capillary Refill: Capillary refill takes less than 2 seconds.  Neurological:     Mental Status: He is alert.  Psychiatric:        Mood and Affect: Mood normal.     ED Results / Procedures / Treatments   Labs (all labs ordered are listed, but only  abnormal results are displayed) Labs Reviewed  CBC WITH DIFFERENTIAL/PLATELET - Abnormal; Notable for the following components:      Result Value   RBC 4.09 (*)    MCV 100.5 (*)    MCH 34.2 (*)    All other components within normal limits  COMPREHENSIVE METABOLIC PANEL - Abnormal; Notable for the following components:   Glucose, Bld 104 (*)    All other components within normal limits  SALICYLATE LEVEL - Abnormal; Notable for the following components:   Salicylate Lvl <3.5 (*)    All other components within normal limits  ACETAMINOPHEN LEVEL - Abnormal; Notable for the following components:   Acetaminophen (Tylenol), Serum <10 (*)    All other components within normal limits  SARS CORONAVIRUS 2 BY RT PCR  ETHANOL  RAPID URINE DRUG SCREEN, HOSP PERFORMED  TROPONIN I (HIGH SENSITIVITY)  TROPONIN I (HIGH SENSITIVITY)    EKG EKG Interpretation  Date/Time:  Friday August 26 2021 18:16:32 EDT Ventricular Rate:  79 PR Interval:  146 QRS Duration: 96 QT Interval:  374 QTC Calculation: 428 R Axis:   40 Text Interpretation: Normal sinus rhythm Normal ECG When compared with ECG of 02-Feb-2019 14:14, No significant change since last tracing Confirmed by Aletta Edouard [phone]) on 08/26/2021 6:19:14 PM  Radiology DG Chest 2 View  Result Date: 08/26/2021 CLINICAL DATA:  Chest pain. EXAM: CHEST - 2 VIEW COMPARISON:  Chest x-ray 02/02/2019 FINDINGS: The heart size and mediastinal contours are within normal limits. Both lungs are clear. The visualized skeletal structures are unremarkable. IMPRESSION: No active cardiopulmonary disease. Electronically Signed   By: Ronney Asters M.D.   On: 08/26/2021 19:37    Procedures Procedures    Medications Ordered in ED Medications - No data to display  ED Course/ Medical Decision Making/ A&P                           Medical Decision Making  This patient presents to the ED for concern of chest pain, this involves an extensive number of treatment  options, and is a complaint that carries with it a high risk of complications and morbidity.  The differential diagnosis includes The emergent causes of chest pain include: Acute coronary syndrome, tamponade, pericarditis/myocarditis, aortic dissection, pulmonary embolism, tension pneumothorax, pneumonia, and esophageal rupture. Other urgent/non-acute considerations include, but are not limited to: chronic angina, aortic stenosis, cardiomyopathy, mitral valve prolapse, pulmonary hypertension, aortic insufficiency, right ventricular hypertrophy, pleuritis, bronchitis, pneumothorax, tumor, gastroesophageal reflux disease (GERD), esophageal spasm, Mallory-Weiss syndrome, peptic ulcer disease, pancreatitis, functional gastrointestinal pain, cervical or thoracic disk disease or arthritis, shoulder arthritis, costochondritis, subacromial bursitis, anxiety or panic attack, herpes zoster,  breast disorders, chest wall tumors, thoracic outlet syndrome, mediastinitis.   Co morbidities that complicate the patient evaluation  Hypertension, major depressive disorder   Additional history obtained:  Additional history obtained from TTS report from 09/04/2019 from prior visit for suicidal ideation External records from outside source obtained and reviewed including overnight observation for psychiatric eval then.   Lab Tests:  I Ordered, and personally interpreted labs.  The pertinent results include: No acute abnormality.   Imaging Studies ordered:  I ordered imaging studies including chest x-ray I independently visualized and interpreted imaging which showed no active cardiopulmonary disease I agree with the radiologist interpretation   Cardiac Monitoring: / EKG:  The patient was maintained on a cardiac monitor.  I personally viewed and interpreted the cardiac monitored which showed an underlying rhythm of: Normal sinus rhythm   Consultations Obtained:  I requested consultation with the TTS,  and  discussed lab and imaging findings as well as pertinent plan - they recommend: Consultation pending upon shift change.   Problem List / ED Course / Critical interventions / Medication management  Chest pain Reevaluation of the patient showed that the patient improved I have reviewed the patients home medicines and have made adjustments as needed   Social Determinants of Health:  Denies tobacco, illicit drug use.   Test / Admission - Considered:  Chest pain Vitals signs significant for hypertension with blood pressure 155/97.. Otherwise within normal range and stable throughout visit. Laboratory/imaging studies significant for: No acute abnormality. Given delta negative troponin and negative EKG findings, heart pathway score of 2 with low risk of 0.9 to 1.7% 30-day Mace.  Wells 0 and PERC negative for PE.  Doubt pericarditis, pneumothorax, COPD/asthma exacerbation. Treatment plan was discussed with patient, the patient was understanding was agreeable to said plan.  Patient was stable upon shift change.        Final Clinical Impression(s) / ED Diagnoses Final diagnoses:  Suicidal ideation  Chest pain, unspecified type    Rx / DC Orders ED Discharge Orders     None         Wilnette Kales, [place] 08/26/21 2226    Hayden Rasmussen, MD 08/27/21 1045

## 2021-08-26 NOTE — ED Triage Notes (Signed)
Pt to ed with c/o chest pain and si.

## 2021-08-26 NOTE — ED Notes (Signed)
Pt belongings moved to [employer].

## 2021-08-27 DIAGNOSIS — F331 Major depressive disorder, recurrent, moderate: Secondary | ICD-10-CM

## 2021-08-27 NOTE — Discharge Instructions (Signed)
Mental Health Resources  [facility] Address: [address], [place], [place] [address] Phone: [phone]  [facility] [place] Address: [address], [place], [place] [address] Hours:  Closed ? Opens 8?AM Mon Phone: [phone] [place] Address: [place], [place], [place] [address] Phone: [phone]  [facility] Address: [address] [place], [place] [address] Phone: [phone]  [facility] Address: [place] [facility], [address], [place], [place] [address] Hours:  Closed ? Opens 8?AM Mon Phone: [phone] [facility] Address: [place], [place], [place] [address] Phone: [phone] INTAKE: [facility] Address: [address], [place], [place] [address] Phone: [phone]  [facility] Address: [address], [place], [place] [address] Phone: [phone]  [facility] Address: [place], [place], [place] [address] Phone: [phone]  [facility] Address: [place], [place],  [address] Phone: [phone] [facility] Phone: [phone]

## 2021-08-27 NOTE — Progress Notes (Signed)
Csw provided the following resources for the patient to utilize upon discharge to connect to mental health resources.  Mental Health Resources  [facility] Address: [address], [place], [place] [address] Phone: [phone]  [facility] [place] Address: [address] [place], [place] [address] Hours:  Closed ? Opens 8?AM Mon Phone: [phone] [place] Address: [place], [place], [place] [address] Phone: [phone]  [facility], [facility] Address: [address], [place], [place] [address] Phone: [phone]  [facility] Address: [place] [facility], [address] [place], [place] [address] Hours:  Closed ? Op[place] 8?AM Mon Phone: [phone] [facility] Address: [place], [place], [place] [address] Phone: [phone] INTAKE: [facility] Address: [address] [place], [place] [address] Phone: [phone]  [facility] Address: [address], [place], [place] [address] Phone: [phone]  [facility] Address: [place], [place],  [address] Phone: [phone]  [facility] Address: [place], [place],  [address] Phone: [phone] [facility] Phone: [phone]   [place], MSW, [facility] [place] Phone: [phone] Disposition/TOC

## 2021-08-27 NOTE — Discharge Summary (Signed)
[facility] Psych ED Discharge  08/27/2021 11:51 AM Mike Richardson  MRN:  [id number]  Principal Problem: MDD (major depressive disorder), recurrent episode, moderate ([place]) Discharge Diagnoses: Principal Problem:   MDD (major depressive disorder), recurrent episode, moderate (H[facility])  Clinical Impression:  Final diagnoses:  Suicidal ideation  Chest pain, unspecified type   Subjective:  41 years old AA male came to the ER with C/O chest pain and suicide ideation.  Hx significant for depression and anxiety.  Patient was at .the urgent care three days ago for headache.  Patient was medically cleared for chest pain.  Patient was seen by provider for suicide ideation this morning.  He denied feeling depressed, anxious or suicidal this morning.  Patient reported that his symptoms were related to relationship Issue.  He reported having issues with his girl friend and she has since moved out of his house.  Patient also stated that he lost his mother last year August and is still having issues with grieving.  He is employed, lives with his three daughters.  Patient vehemently denied feeling suicidal stating that the issue with his personal life is solved.  Patient was receiving therapy in the past as far back as 2021 and want to get back in therapy.  Patient has not been on medications for a while, has no provider and no medications in the last three months.  Patient denied previous suicide attempt or family hx of suicide attempt..  Patient is alert and oriented x 5 and again denied feeling SI/HI/AVH and no mention of Paranoia/  He would like resources to get back on his medications and therapy.  Patient at this time does not meet criteria for inpatient hospitalization.  Patient is discharged with resources for outpatient Mental health care/Therapy.  ED Assessment Time Calculation: Start Time: 1129 Stop Time: 1151 Total Time in Minutes (Assessment Completion): 22   Past Psychiatric History: Depression,  anxiety.  No previous inpatient Psychiatric hospitalization.  Currently has no Mental health provider.  Past Medical History:  Past Medical History:  Diagnosis Date   Hypertension    History reviewed. No pertinent surgical history. Family History: History reviewed. No pertinent family history. Family Psychiatric  History: Denied Social History:  Social History   Substance and Sexual Activity  Alcohol Use None   Comment: socially     Social History   Substance and Sexual Activity  Drug Use Never    Social History   Socioeconomic History   Marital status: Divorced    Spouse name: Not on file   Number of children: 3   Years of education: Not on file   Highest education level: Not on file  Occupational History   Not on file  Tobacco Use   Smoking status: Never   Smokeless tobacco: Never  Vaping Use   Vaping Use: Never used  Substance and Sexual Activity   Alcohol use: Not on file    Comment: socially   Drug use: Never   Sexual activity: Not on file  Other Topics Concern   Not on file  Social History Narrative   Divorced, 3 daughters ages 86,12,7 live with him   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    Social Determinants of Health   Financial Resource Strain: Not on file  Food Insecurity: Not on file  Transportation Needs: Not on file  Physical Activity: Not on file  Stress: Not on file  Social Connections: Not on file  Tobacco Cessation:  N/A, patient does not currently use tobacco products  Current Medications: No current facility-administered medications for this encounter.   No current outpatient medications on file.   PTA Medications: (Not in a hospital admission)   [place]:  [place] ED from 08/26/2021 in [place] [facility] ED from 08/20/2021 in [facility]  ED from 09/04/2019 in [place] [facility]  C-SSRS RISK  CATEGORY Moderate Risk No Risk Moderate Risk       Musculoskeletal: Strength & Muscle Tone: within normal limits Gait & Station: normal Patient leans: Front  Psychiatric Specialty Exam: Presentation  General Appearance: Appropriate for Environment; Well Groomed  Eye Contact:Good  Speech:Clear and Coherent; Normal Rate  Speech Volume:Normal  Handedness:Right   Mood and Affect  Mood:Euthymic  Affect:Congruent   Thought Process  Thought Processes:No data recorded Descriptions of Associations:Intact  Orientation:Full (Time, Place and Person)  Thought Content:Logical  History of Schizophrenia/Schizoaffective disorder:No data recorded Duration of Psychotic Symptoms:No data recorded Hallucinations:Hallucinations: None  Ideas of Reference:None  Suicidal Thoughts:Suicidal Thoughts: No  Homicidal Thoughts:Homicidal Thoughts: No   Sensorium  Memory:Immediate Good; Recent Good; Remote Good  Judgment:Good  Insight:Good   Executive Functions  Concentration:Good  Attention Span:Good  [place] of Knowledge:Good  Language:Good   Psychomotor Activity  Psychomotor Activity:Psychomotor Activity: Normal   Assets  Assets:Communication Skills; Desire for Improvement; Housing; Physical Health; Financial Resources/Insurance   Sleep  Sleep:Sleep: Good    Physical Exam: Physical Exam Vitals and nursing note reviewed.  Constitutional:      Appearance: Normal appearance. He is well-developed.  HENT:     Head: Normocephalic and atraumatic.  Cardiovascular:     Rate and Rhythm: Normal rate.  Pulmonary:     Effort: Pulmonary effort is normal.  Musculoskeletal:        General: Normal range of motion.     Cervical back: Normal range of motion.  Skin:    General: Skin is warm and dry.  Neurological:     General: No focal deficit present.     Mental Status: He is alert and oriented to person, place, and time.    Review of Systems   Constitutional: Negative.   HENT: Negative.    Eyes: Negative.   Respiratory: Negative.    Cardiovascular: Negative.   Gastrointestinal: Negative.   Genitourinary: Negative.   Musculoskeletal: Negative.   Skin: Negative.   Neurological: Negative.   Endo/Heme/Allergies: Negative.   Psychiatric/Behavioral:  Positive for depression. The patient is nervous/anxious.    Blood pressure (!) 145/74, pulse 60, temperature (!) 97.5 F (36.4 C), temperature source Oral, resp. rate 18, height '6\' 2"'$  (1.88 m), weight (!) 145.2 kg, SpO2 99 %. Body mass index is 41.09 kg/m.   Demographic Factors:  Male, Adolescent or young adult, and Divorced or widowed  Loss Factors: Loss of significant relationship  Historical Factors: NA  Risk Reduction Factors:   Responsible for children under 49 years of age, Sense of responsibility to family, Employed, Living with another person, especially a relative, and Positive coping skills or problem solving skills  Continued Clinical Symptoms:  Depression:   Insomnia  Cognitive Features That Contribute To Risk:  None    Suicide Risk:  Minimal: No identifiable suicidal ideation.  Patients presenting with no risk factors but with morbid ruminations; may be classified as minimal risk based on the severity of the depressive symptoms    Plan Of Care/Follow-up recommendations:  Activity:  as tolerated  Diet:  regular  Medical Decision Making: Patient does not meet criteria for inpatient Psychiatric hospitalization.  He denied feeling depressed, anxious and no suicidal ideation.  He reported that the relationship issues has been solved.  We will offer resources for outpatient therapy and mental health provider.   Problem 1: Recurrent Major Depressive disorder, moderate without Psychotic features  Problem 2: Anxiety disorder   Disposition: Discharge  Delfin Gant, NP-PMHNP-BC 08/27/2021, 11:51 AM

## 2021-08-27 NOTE — ED Provider Notes (Signed)
Emergency Medicine Observation Re-evaluation Note  Mike Richardson is a 41 y.o. male, seen on rounds today.  Pt initially presented to the ED for complaints of Chest Pain and Suicidal Currently, the patient is sleeping.  Physical Exam  BP (!) 145/74 (BP Location: Right Arm)   Pulse 60   Temp (!) 97.5 F (36.4 C) (Oral)   Resp 18   Ht '6\' 2"'$  (1.88 m)   Wt (!) 145.2 kg   SpO2 99%   BMI 41.09 kg/m  Physical Exam General: Sleeping Cardiac: Extremities perfused Lungs: Breathing is unlabored Psych: Deferred  ED Course / MDM  EKG:EKG Interpretation  Date/Time:  Friday August 26 2021 18:16:32 EDT Ventricular Rate:  79 PR Interval:  146 QRS Duration: 96 QT Interval:  374 QTC Calculation: 428 R Axis:   40 Text Interpretation: Normal sinus rhythm Normal ECG When compared with ECG of 02-Feb-2019 14:14, No significant change since last tracing Confirmed by Aletta Edouard [phone]) on 08/26/2021 6:19:14 PM  I have reviewed the labs performed to date as well as medications administered while in observation.  Recent changes in the last 24 hours include patient presented to the ED yesterday for SI.  He is currently awaiting TTS evaluation.  Plan  Current plan is for TTS evaluation.  Mike Richardson is not under involuntary commitment.     Godfrey Pick, MD 08/27/21 [id number]

## 2021-08-27 NOTE — ED Notes (Signed)
Patient refused to eat his meal

## 2021-10-17 NOTE — Progress Notes (Signed)
Erroneous encounter-disregard

## 2021-10-25 ENCOUNTER — Encounter: Payer: BC Managed Care – PPO | Admitting: Family

## 2021-10-25 DIAGNOSIS — Z7689 Persons encountering health services in other specified circumstances: Secondary | ICD-10-CM
